# Patient Record
Sex: Female | Born: 1995 | Race: White | Hispanic: No | Marital: Single | Smoking: Never smoker
Health system: Southern US, Community
[De-identification: ages and names within clinical notes are randomized; demographics above are authoritative.]

## PROBLEM LIST (undated history)

## (undated) DIAGNOSIS — Z3490 Encounter for supervision of normal pregnancy, unspecified, unspecified trimester: Secondary | ICD-10-CM

## (undated) DIAGNOSIS — F32A Depression, unspecified: Secondary | ICD-10-CM

## (undated) DIAGNOSIS — F329 Major depressive disorder, single episode, unspecified: Secondary | ICD-10-CM

## (undated) HISTORY — DX: Depression, unspecified: F32.A

## (undated) HISTORY — DX: Major depressive disorder, single episode, unspecified: F32.9

---

## 2013-03-12 ENCOUNTER — Encounter (HOSPITAL_COMMUNITY): Payer: Self-pay | Admitting: Emergency Medicine

## 2013-03-12 ENCOUNTER — Emergency Department (HOSPITAL_COMMUNITY)
Admission: EM | Admit: 2013-03-12 | Discharge: 2013-03-12 | Disposition: A | Discharge status: Home / Self Care | Payer: Medicaid Other | Attending: Emergency Medicine | Admitting: Emergency Medicine

## 2013-03-12 DIAGNOSIS — M545 Low back pain, unspecified: Secondary | ICD-10-CM | POA: Insufficient documentation

## 2013-03-12 DIAGNOSIS — M549 Dorsalgia, unspecified: Secondary | ICD-10-CM

## 2013-03-12 DIAGNOSIS — O9989 Other specified diseases and conditions complicating pregnancy, childbirth and the puerperium: Secondary | ICD-10-CM | POA: Insufficient documentation

## 2013-03-12 DIAGNOSIS — Z79899 Other long term (current) drug therapy: Secondary | ICD-10-CM | POA: Insufficient documentation

## 2013-03-12 HISTORY — DX: Encounter for supervision of normal pregnancy, unspecified, unspecified trimester: Z34.90

## 2013-03-12 LAB — URINALYSIS, ROUTINE W REFLEX MICROSCOPIC
Bilirubin Urine: NEGATIVE
Glucose, UA: NEGATIVE mg/dL
Ketones, ur: 15 mg/dL — AB
Leukocytes, UA: NEGATIVE
Nitrite: NEGATIVE
Protein, ur: NEGATIVE mg/dL
Urobilinogen, UA: 0.2 mg/dL (ref 0.0–1.0)
pH: 6 (ref 5.0–8.0)

## 2013-03-12 NOTE — ED Notes (Signed)
Pt is pregnant,  ?3 mos.  Back pain, No urinay sx.  No bleeding.

## 2013-03-12 NOTE — ED Provider Notes (Signed)
CSN: [id number]     Arrival date & time 03/12/13  1846 History  This chart was scribed for Benny Lennert, MD by Bennett Scrape, ED Scribe. This patient was seen in room APA01/APA01 and the patient's care was started at 9:40 PM.  Chief Complaint  Patient presents with  . Back Pain    Patient is a 17 y.o. female presenting with back pain. The history is provided by the patient. No language interpreter was used.  Back Pain Location:  Lumbar spine Quality:  Aching Radiates to:  Does not radiate Pain severity:  Moderate Onset quality:  Sudden Timing:  Constant Progression:  Worsening Chronicity:  New Relieved by:  Being still Worsened by:  Palpation (moving) Ineffective treatments:  Ibuprofen (Tylenol) Associated symptoms: no abdominal pain, no chest pain, no fever and no headaches   Risk factors: pregnancy     HPI Comments: Daelynn Blower is a 17 y.o. female who is currently [redacted] weeks pregnant with no prior prenatal care presents to the Emergency Department complaining of lower back pain for the past week. She states the pain is worsened by moving. She reports taking one tylenol pill this morning that provided no relief. She denies any fever or cough. Her LMNP was on September 8th, 2014. She denies any prior medical problems.   She states she is in the process of moving so currently does not have a PCP.  Past Medical History  Diagnosis Date  . Pregnant    History reviewed. No pertinent past surgical history. History reviewed. No pertinent family history. History  Substance Use Topics  . Smoking status: Never Smoker   . Smokeless tobacco: Not on file  . Alcohol Use: No   No OB history provided.  Review of Systems  Constitutional: Negative for fever, appetite change and fatigue.  HENT: Negative for congestion, ear discharge and sinus pressure.   Eyes: Negative for discharge.  Respiratory: Negative for cough.   Cardiovascular: Negative for chest pain.  Gastrointestinal:  Negative for abdominal pain and diarrhea.  Genitourinary: Negative for frequency and hematuria.  Musculoskeletal: Positive for back pain.  Skin: Negative for rash.  Neurological: Negative for seizures and headaches.  Psychiatric/Behavioral: Negative for hallucinations.  All other systems reviewed and are negative.    Allergies  Review of patient's allergies indicates no known allergies.  Home Medications   Current Outpatient Rx  Name  Route  Sig  Dispense  Refill  . acetaminophen (TYLENOL) 500 MG tablet   Oral   Take 500 mg by mouth every 6 (six) hours as needed.         . Prenatal Vit-Min-FA-Fish Oil ([facility] PRENATAL GUMMY) 0.4-113.5 MG CHEW   Oral   Chew by mouth daily.          Triage Vitals: BP 108/55  Pulse 123  Temp(Src) 98.6 F (37 C) (Oral)  Resp 20  Ht 5\' 3"  (1.6 m)  Wt 195 lb (88.451 kg)  BMI 34.55 kg/m2  SpO2 100%  LMP 12/15/2012  Physical Exam  Nursing note and vitals reviewed. Constitutional: She is oriented to person, place, and time. She appears well-developed and well-nourished.  HENT:  Head: Normocephalic.  Eyes: Conjunctivae and EOM are normal. No scleral icterus.  Neck: Neck supple. No thyromegaly present.  Cardiovascular: Normal rate and regular rhythm.  Exam reveals no gallop and no friction rub.   No murmur heard. Pulmonary/Chest: Effort normal and breath sounds normal. No stridor. She has no wheezes. She has no rales. She exhibits  no tenderness.  Abdominal: She exhibits no distension. There is no tenderness. There is no rebound.  Musculoskeletal: Normal range of motion. She exhibits tenderness (Mild, Lumbar Spine). She exhibits no edema.  Lymphadenopathy:    She has no cervical adenopathy.  Neurological: She is oriented to person, place, and time. She exhibits normal muscle tone. Coordination normal.  Skin: Skin is warm and dry. No rash noted. No erythema.  Psychiatric: She has a normal mood and affect. Her behavior is normal.    ED  Course  Procedures (including critical care time)  DIAGNOSTIC STUDIES: Oxygen Saturation is 100% on room air, normal by my interpretation.    COORDINATION OF CARE: 9:45 PM-Discussed treatment plan which includes UA with pt at bedside and pt agreed to plan.      Labs Review Labs Reviewed  URINALYSIS, ROUTINE W REFLEX MICROSCOPIC  PREGNANCY, URINE   Imaging Review No results found.  EKG Interpretation   None       MDM  Back pain      The chart was scribed for me under my direct supervision.  I personally performed the history, physical, and medical decision making and all procedures in the evaluation of this patient.Benny Lennert, MD 03/13/13 [phone]

## 2013-03-12 NOTE — ED Notes (Signed)
Pt reports back pain x1 week - pain reported to be entire back (upper/mid/lower) - pt has taken tylenol OTC w/o relief, last dose was this a.m. Pt admits she is approx [redacted] weeks pregnant (LNMP 12/15/2012) has been experiencing some morning sickness, denies any fever, mechanism of injury or hx of back pain. Pt reports normal bowel and urinary patterns.

## 2013-10-15 ENCOUNTER — Encounter: Payer: Self-pay | Admitting: Family

## 2013-10-15 ENCOUNTER — Encounter (INDEPENDENT_AMBULATORY_CARE_PROVIDER_SITE_OTHER): Payer: Self-pay

## 2013-10-15 ENCOUNTER — Other Ambulatory Visit: Payer: Self-pay | Admitting: Family

## 2013-10-15 ENCOUNTER — Ambulatory Visit (INDEPENDENT_AMBULATORY_CARE_PROVIDER_SITE_OTHER): Payer: Medicaid Other | Admitting: Family

## 2013-10-15 VITALS — BP 136/73 | HR 75 | Temp 100.0°F | Ht 62.5 in | Wt 193.6 lb

## 2013-10-15 DIAGNOSIS — M546 Pain in thoracic spine: Secondary | ICD-10-CM

## 2013-10-15 LAB — POCT UA - MICROSCOPIC ONLY
Bacteria, U Microscopic: NEGATIVE
CASTS, UR, LPF, POC: NEGATIVE
Crystals, Ur, HPF, POC: NEGATIVE
Mucus, UA: NEGATIVE
Yeast, UA: NEGATIVE

## 2013-10-15 LAB — POCT URINALYSIS DIPSTICK
Bilirubin, UA: NEGATIVE
GLUCOSE UA: NEGATIVE
Ketones, UA: NEGATIVE
NITRITE UA: NEGATIVE
PH UA: 6
Protein, UA: NEGATIVE
Spec Grav, UA: 1.01
Urobilinogen, UA: NEGATIVE

## 2013-10-15 MED ORDER — CYCLOBENZAPRINE HCL 5 MG PO TABS: 5.0000 mg | ORAL_TABLET | Freq: Three times a day (TID) | ORAL | Status: DC | PRN

## 2013-10-15 MED ORDER — NITROFURANTOIN MONOHYD MACRO 100 MG PO CAPS: 100.0000 mg | ORAL_CAPSULE | Freq: Two times a day (BID) | ORAL | Status: DC

## 2013-10-15 MED ORDER — NAPROXEN 500 MG PO TABS: 500.0000 mg | ORAL_TABLET | Freq: Two times a day (BID) | ORAL | Status: DC

## 2013-10-15 NOTE — Patient Instructions (Signed)

## 2013-10-15 NOTE — Progress Notes (Signed)
   Subjective:    Patient ID: Kristin Morgan, female    DOB: Apr 02, 1996, 18 y.o.   MRN: [id number]  Back Pain This is a new problem. The current episode started yesterday. The problem occurs constantly. The problem is unchanged. The pain is present in the thoracic spine. The quality of the pain is described as aching. The pain does not radiate. The pain is at a severity of 6/10. The pain is moderate. The pain is the same all the time. The symptoms are aggravated by sitting and lying down. Pertinent negatives include no dysuria, headaches, pelvic pain, tingling or weakness. Risk factors include pregnancy. She has tried ice, NSAIDs and heat for the symptoms. The treatment provided moderate relief.   *Pt gave birth 09/29/13. Pt is not breastfeeding.    Review of Systems  Constitutional: Negative.   HENT: Negative.   Eyes: Negative.   Respiratory: Negative.  Negative for shortness of breath.   Cardiovascular: Negative.  Negative for palpitations.  Gastrointestinal: Negative.   Endocrine: Negative.   Genitourinary: Negative.  Negative for dysuria and pelvic pain.  Musculoskeletal: Positive for back pain.  Skin: Negative.   Neurological: Negative.  Negative for tingling, weakness and headaches.  Hematological: Negative.   Psychiatric/Behavioral: Negative.   All other systems reviewed and are negative.      Objective:   Physical Exam  Vitals reviewed. Constitutional: She is oriented to person, place, and time. She appears well-developed and well-nourished. No distress.  Eyes: Pupils are equal, round, and reactive to light.  Neck: Normal range of motion. Neck supple. No thyromegaly present.  Cardiovascular: Normal rate, regular rhythm, normal heart sounds and intact distal pulses.   No murmur heard. Pulmonary/Chest: Effort normal and breath sounds normal. No respiratory distress. She has no wheezes.  Abdominal: Soft. Bowel sounds are normal. She exhibits no distension. There is no tenderness.   Musculoskeletal: Normal range of motion. She exhibits no edema and no tenderness.  Full ROM, but slow movement due to pain  Neurological: She is alert and oriented to person, place, and time. She has normal reflexes. No cranial nerve deficit.  Skin: Skin is warm and dry.  Psychiatric: She has a normal mood and affect. Her behavior is normal. Judgment and thought content normal.    BP 136/73  Pulse 75  Temp(Src) 100 F (37.8 C) (Oral)  Ht 5' 2.5" (1.588 m)  Wt 193 lb 9.6 oz (87.816 kg)  BMI 34.82 kg/m2       Assessment & Plan:  1. Bilateral thoracic back pain -Rest -Ice -No NSAID's only tylenol prn -Discussed flexeril may cause drowsiness- - cyclobenzaprine (FLEXERIL) 5 MG tablet; Take 1 tablet (5 mg total) by mouth 3 (three) times daily as needed for muscle spasms.  Dispense: 30 tablet; Refill: 1 - naproxen (NAPROSYN) 500 MG tablet; Take 1 tablet (500 mg total) by mouth 2 (two) times daily with a meal.  Dispense: 60 tablet; Refill: 1  Orders Placed This Encounter  Procedures  . POCT urinalysis dipstick  . POCT UA - Microscopic Only    Jannifer Rodneyhristy Cleta Heatley, FNP

## 2013-10-22 ENCOUNTER — Ambulatory Visit (INDEPENDENT_AMBULATORY_CARE_PROVIDER_SITE_OTHER): Payer: Medicaid Other | Admitting: Family Medicine

## 2013-10-22 VITALS — BP 97/54 | HR 90 | Temp 97.1°F | Ht 62.5 in | Wt 195.6 lb

## 2013-10-22 DIAGNOSIS — W57XXXA Bitten or stung by nonvenomous insect and other nonvenomous arthropods, initial encounter: Secondary | ICD-10-CM

## 2013-10-22 DIAGNOSIS — T148 Other injury of unspecified body region: Secondary | ICD-10-CM

## 2013-10-22 MED ORDER — DOXYCYCLINE HYCLATE 100 MG PO TABS: 100.0000 mg | ORAL_TABLET | Freq: Two times a day (BID) | ORAL | Status: DC

## 2013-10-22 NOTE — Progress Notes (Signed)
   Subjective:    Patient ID: Kristin Morgan, female    DOB: Nov 28, 1995, 18 y.o.   MRN: [id number]  HPI This 18 y.o. female presents for evaluation of insect bite left thigh.   Review of Systems C/o insect bite No chest pain, SOB, HA, dizziness, vision change, N/V, diarrhea, constipation, dysuria, urinary urgency or frequency, myalgias, arthralgias or rash.     Objective:   Physical Exam Vital signs noted  Well developed well nourished female.  HEENT - Head atraumatic Normocephalic Respiratory - Lungs CTA bilateral Cardiac - RRR S1 and S2 without murmur Skin - Erythema with central injection site with central clearing and erythema approx 2cm diameter      Assessment & Plan:  Insect bite - Plan: doxycycline (VIBRA-TABS) 100 MG tablet bid x 10 days #20 Explained to use sun screen if outside Follow up prn  Deatra CanterWilliam J Oxford FNP

## 2013-12-15 ENCOUNTER — Ambulatory Visit (INDEPENDENT_AMBULATORY_CARE_PROVIDER_SITE_OTHER): Payer: Medicaid Other | Admitting: Family Medicine

## 2013-12-15 VITALS — BP 130/66 | HR 80 | Temp 97.3°F | Ht 62.5 in | Wt 202.6 lb

## 2013-12-15 DIAGNOSIS — J069 Acute upper respiratory infection, unspecified: Secondary | ICD-10-CM

## 2013-12-15 MED ORDER — PREDNISONE 10 MG PO TABS: ORAL_TABLET | ORAL | Status: DC

## 2013-12-15 MED ORDER — AZITHROMYCIN 250 MG PO TABS: ORAL_TABLET | ORAL | Status: DC

## 2013-12-15 NOTE — Progress Notes (Signed)
   Subjective:    Patient ID: Kristin Morgan, female    DOB: 03/26/1996, 18 y.o.   MRN: [id number]  HPI This 18 y.o. female presents for evaluation of URI.   Review of Systems No chest pain, SOB, HA, dizziness, vision change, N/V, diarrhea, constipation, dysuria, urinary urgency or frequency, myalgias, arthralgias or rash.     Objective:   Physical Exam  Vital signs noted  Well developed well nourished female.  HEENT - Head atraumatic Normocephalic                Eyes - PERRLA, Conjuctiva - clear Sclera- Clear EOMI                Ears - EAC's Wnl TM's Wnl Gross Hearing WNL                Nose - Nares patent                 Throat - oropharanx wnl Respiratory - Lungs CTA bilateral Cardiac - RRR S1 and S2 without murmur GI - Abdomen soft Nontender and bowel sounds active x 4 Extremities - No edema. Neuro - Grossly intact.      Assessment & Plan:  URI (upper respiratory infection) - Plan: azithromycin (ZITHROMAX) 250 MG tablet, predniSONE (DELTASONE) 10 MG tablet Push po fluids, rest, tylenol and motrin otc prn as directed for fever, arthralgias, and myalgias.  Follow up prn if sx's continue or persist.  Deatra Canter FNP

## 2014-01-01 ENCOUNTER — Encounter: Payer: Self-pay | Admitting: Family Medicine

## 2014-01-01 ENCOUNTER — Telehealth: Payer: Self-pay | Admitting: Family Medicine

## 2014-01-01 ENCOUNTER — Ambulatory Visit (INDEPENDENT_AMBULATORY_CARE_PROVIDER_SITE_OTHER): Payer: Medicaid Other | Admitting: Family Medicine

## 2014-01-01 VITALS — BP 112/59 | HR 73 | Temp 98.1°F | Ht 62.5 in | Wt 207.0 lb

## 2014-01-01 DIAGNOSIS — J0110 Acute frontal sinusitis, unspecified: Secondary | ICD-10-CM

## 2014-01-01 DIAGNOSIS — J0111 Acute recurrent frontal sinusitis: Secondary | ICD-10-CM

## 2014-01-01 MED ORDER — MONTELUKAST SODIUM 10 MG PO TABS: 10.0000 mg | ORAL_TABLET | Freq: Every day | ORAL | Status: DC

## 2014-01-01 MED ORDER — AMOXICILLIN 875 MG PO TABS: 875.0000 mg | ORAL_TABLET | Freq: Two times a day (BID) | ORAL | Status: DC

## 2014-01-01 MED ORDER — METHYLPREDNISOLONE ACETATE 80 MG/ML IJ SUSP
80.0000 mg | Freq: Once | INTRAMUSCULAR | Status: AC
  Administered 2014-01-01: 80 mg via INTRAMUSCULAR

## 2014-01-01 MED ORDER — FLUCONAZOLE 150 MG PO TABS: 150.0000 mg | ORAL_TABLET | Freq: Once | ORAL | Status: DC

## 2014-01-01 NOTE — Telephone Encounter (Signed)
Scheduled to see provider today

## 2014-01-01 NOTE — Progress Notes (Signed)
   Subjective:    Patient ID: Kristin Morgan, female    DOB: 1995-05-02, 18 y.o.   MRN: [id number]  HPI This 18 y.o. female presents for evaluation of sinus pressure and facial pain.  She is having green nasal discharge. She has chronic URI's and she states she has allergies and she cannot take  flonase nasal spray.   Review of Systems No chest pain, SOB, HA, dizziness, vision change, N/V, diarrhea, constipation, dysuria, urinary urgency or frequency, myalgias, arthralgias or rash.     Objective:   Physical Exam  Vital signs noted  Well developed well nourished female.  HEENT - Head atraumatic Normocephalic                Eyes - PERRLA, Conjuctiva - clear Sclera- Clear EOMI                Ears - EAC's Wnl TM's Wnl Gross Hearing WNL                Nose - Nares patent                 Throat - oropharanx wnl Respiratory - Lungs CTA bilateral Cardiac - RRR S1 and S2 without murmur GI - Abdomen soft Nontender and bowel sounds active x 4 Extremities - No edema. Neuro - Grossly intact.      Assessment & Plan:  Acute recurrent frontal sinusitis - Plan: amoxicillin (AMOXIL) 875 MG tablet, montelukast (SINGULAIR) 10 MG tablet, methylPREDNISolone acetate (DEPO-MEDROL) injection 80 mg, fluconazole (DIFLUCAN) 150 MG tablet  Deatra Canter FNP

## 2014-02-01 ENCOUNTER — Encounter (INDEPENDENT_AMBULATORY_CARE_PROVIDER_SITE_OTHER): Payer: Self-pay

## 2014-02-01 ENCOUNTER — Ambulatory Visit (INDEPENDENT_AMBULATORY_CARE_PROVIDER_SITE_OTHER): Payer: Medicaid Other | Admitting: Family Medicine

## 2014-02-01 ENCOUNTER — Encounter: Payer: Self-pay | Admitting: Family Medicine

## 2014-02-01 VITALS — BP 107/64 | HR 86 | Temp 97.1°F | Ht 62.5 in | Wt 202.2 lb

## 2014-02-01 DIAGNOSIS — R21 Rash and other nonspecific skin eruption: Secondary | ICD-10-CM

## 2014-02-01 MED ORDER — HYDROXYZINE HCL 25 MG PO TABS: 25.0000 mg | ORAL_TABLET | Freq: Three times a day (TID) | ORAL | Status: DC | PRN

## 2014-02-01 MED ORDER — METHYLPREDNISOLONE (PAK) 4 MG PO TABS: ORAL_TABLET | ORAL | Status: DC

## 2014-02-01 MED ORDER — METHYLPREDNISOLONE ACETATE 80 MG/ML IJ SUSP
80.0000 mg | Freq: Once | INTRAMUSCULAR | Status: AC
  Administered 2014-02-01: 80 mg via INTRAMUSCULAR

## 2014-02-01 NOTE — Progress Notes (Signed)
   Subjective:    Patient ID: Kristin Morgan, female    DOB: 1996/01/03, 18 y.o.   MRN: [id number]  HPI C/o rash on right arm, breast, abdomen, and back for 1 day.  She was exposed to cats which she is allergic to.  Review of Systems  Constitutional: Negative for fever.  HENT: Negative for ear pain.   Eyes: Negative for discharge.  Respiratory: Negative for cough.   Cardiovascular: Negative for chest pain.  Gastrointestinal: Negative for abdominal distention.  Endocrine: Negative for polyuria.  Genitourinary: Negative for difficulty urinating.  Musculoskeletal: Negative for gait problem and neck pain.  Skin: Positive for rash. Negative for color change.       Erythematous macular papular rash on right arm, abdomen, chest, and back.  Neurological: Negative for speech difficulty and headaches.  Psychiatric/Behavioral: Negative for agitation.       Objective:    BP 107/64  Pulse 86  Temp(Src) 97.1 F (36.2 C) (Oral)  Ht 5' 2.5" (1.588 m)  Wt 202 lb 3.2 oz (91.717 kg)  BMI 36.37 kg/m2 Physical Exam  Constitutional: She is oriented to person, place, and time. She appears well-developed and well-nourished.  HENT:  Head: Normocephalic and atraumatic.  Mouth/Throat: Oropharynx is clear and moist.  Eyes: Pupils are equal, round, and reactive to light.  Neck: Normal range of motion. Neck supple.  Cardiovascular: Normal rate and regular rhythm.   No murmur heard. Pulmonary/Chest: Effort normal and breath sounds normal.  Abdominal: Soft. Bowel sounds are normal. There is no tenderness.  Neurological: She is alert and oriented to person, place, and time.  Skin: Skin is warm and dry. Rash noted.  Psychiatric: She has a normal mood and affect.          Assessment & Plan:     ICD-9-CM ICD-10-CM   1. Rash and nonspecific skin eruption 782.1 R21 methylPREDNISolone acetate (DEPO-MEDROL) injection 80 mg     methylPREDNIsolone (MEDROL DOSPACK) 4 MG tablet     hydrOXYzine  (ATARAX/VISTARIL) 25 MG tablet     Return if symptoms worsen or fail to improve.  Deatra CanterWilliam J Yvaine Jankowiak FNP

## 2014-02-02 ENCOUNTER — Ambulatory Visit: Payer: Self-pay | Admitting: Family Medicine

## 2014-02-02 ENCOUNTER — Telehealth: Payer: Self-pay | Admitting: Family Medicine

## 2014-02-02 NOTE — Telephone Encounter (Signed)
Pt cb, states she received depo-medrol injection in office yesterday and put on steroids, for allergic reaction, pt states rash has spread and raised and now is burning on the inside. Pt states she's not having trouble breathing or facial swelling. Pt given appt with Ander SladeBill Oxford today at 6:30

## 2014-02-17 ENCOUNTER — Telehealth: Payer: Self-pay | Admitting: Nurse Practitioner

## 2014-02-17 MED ORDER — FLUCONAZOLE 150 MG PO TABS: 150.0000 mg | ORAL_TABLET | Freq: Once | ORAL | Status: DC

## 2014-02-17 NOTE — Telephone Encounter (Signed)
Stp, she is requesting rx for yeast infections, states she was seen by Annette StableBill on 02/01/14 and she was given Medrol-dose pack for rash, she has had to take multiple oatmeal baths since the rash and thinks this is how she got the yeast infection. Her symptoms include vaginal itching, redness, white cottage cheese like discharge. No foul vaginal odors. Please advise, [facility]

## 2014-03-03 ENCOUNTER — Telehealth: Payer: Self-pay | Admitting: Family Medicine

## 2014-03-03 MED ORDER — AZITHROMYCIN 250 MG PO TABS: ORAL_TABLET | ORAL | Status: DC

## 2014-03-03 NOTE — Telephone Encounter (Signed)
Sinus infection -  Yellow mucus Headache Drainage Sinus pressure   [facility]  Wants zpak called in today

## 2014-03-03 NOTE — Telephone Encounter (Signed)
This is okay to give her a Z-Pak and please instruct her to use saline nose spray frequently. If she is not better she needs to come in and be seen next week.

## 2014-03-03 NOTE — Telephone Encounter (Signed)
Pt aware and med sent in  

## 2014-03-19 ENCOUNTER — Other Ambulatory Visit: Payer: Self-pay | Admitting: Family Medicine

## 2014-03-19 DIAGNOSIS — R21 Rash and other nonspecific skin eruption: Secondary | ICD-10-CM

## 2014-03-19 MED ORDER — HYDROXYZINE HCL 25 MG PO TABS: 25.0000 mg | ORAL_TABLET | Freq: Three times a day (TID) | ORAL | Status: DC | PRN

## 2014-03-19 MED ORDER — METHYLPREDNISOLONE (PAK) 4 MG PO TABS: ORAL_TABLET | ORAL | Status: DC

## 2014-05-22 ENCOUNTER — Telehealth: Payer: Self-pay | Admitting: Family

## 2014-05-24 NOTE — Telephone Encounter (Signed)
Sorry has not been seen since October- will NTBS

## 2014-05-25 ENCOUNTER — Ambulatory Visit (INDEPENDENT_AMBULATORY_CARE_PROVIDER_SITE_OTHER): Payer: Medicaid Other | Admitting: Nurse Practitioner

## 2014-05-25 ENCOUNTER — Encounter: Payer: Self-pay | Admitting: Nurse Practitioner

## 2014-05-25 VITALS — BP 114/69 | HR 84 | Temp 97.3°F | Ht 62.5 in | Wt 210.0 lb

## 2014-05-25 DIAGNOSIS — J0100 Acute maxillary sinusitis, unspecified: Secondary | ICD-10-CM

## 2014-05-25 MED ORDER — AZITHROMYCIN 250 MG PO TABS: 250.0000 mg | ORAL_TABLET | Freq: Every day | ORAL | Status: DC

## 2014-05-25 NOTE — Telephone Encounter (Signed)
Stp given appt today with MMM at 6:15.

## 2014-05-25 NOTE — Progress Notes (Signed)
  Subjective:     Kristin Morgan is a 19 y.o. female who presents for evaluation of symptoms of a URI, possible sinusitis. Symptoms include congestion, cough described as productive and productive of yellow sputum, nasal congestion, no  fever, post nasal drip,  sinus pressure, sneezing and sore throat. Onset of symptoms was 3 days ago, and has been gradually worsening since that time. Treatment to date: antihistamines and decongestants.  The following portions of the patient's history were reviewed and updated as appropriate: allergies, current medications, past family history, past medical history, past social history, past surgical history and problem list.  Review of Systems A comprehensive review of systems was negative.   Objective:    BP 114/69 mmHg  Pulse 84  Temp(Src) 97.3 F (36.3 C) (Oral)  Ht 5' 2.5" (1.588 m)  Wt 210 lb (95.255 kg)  BMI 37.77 kg/m2 General appearance: alert, cooperative and appears stated age Head: Normocephalic, without obvious abnormality, atraumatic Eyes: conjunctivae/corneas clear. PERRL, EOM's intact. Fundi benign. Ears: normal TM's and external ear canals both ears Nose: Nares normal. Septum midline. Mucosa normal. No drainage. Maxillary sinus tenderness bilaterally. Throat: lips, mucosa, and tongue normal; teeth and gums normal Neck: no adenopathy, no carotid bruit, no JVD, supple, symmetrical, trachea midline and thyroid not enlarged, symmetric, no tenderness/mass/nodules Lungs: clear to auscultation bilaterally  Cardiac: RRR, No murmur, gallop,  Neurologic: Grossly normal   Assessment:  Maxillary sinusitis    Plan:     1. Take meds as prescribed 2. Use a cool mist humidifier especially during the winter months and when heat has been humid. 3. Use saline nose sprays frequently 4. Saline irrigations of the nose can be very helpful if done frequently.  * 4X daily for 1 week*  * Use of a nettie pot can be helpful with this. Follow directions with  this* 5. Drink plenty of fluids 6. Keep thermostat turn down low 7.For any cough or congestion  Use plain Mucinex- regular strength or max strength is fine   * Children- consult with Pharmacist for dosing 8. For fever or aces or pains- take tylenol or ibuprofen appropriate for age and weight.  * for fevers greater than 101 orally you may alternate ibuprofen and tylenol every  3 hours.   Meds ordered this encounter  Medications  . azithromycin (ZITHROMAX) 250 MG tablet    Sig: Take 1 tablet (250 mg total) by mouth daily.    Dispense:  6 each    Refill:  0    Order Specific Question:  Supervising Provider    Answer:  Ernestina PennaMOORE, DONALD W [id number]]   Kristin Daphine DeutscherMartin, FNP

## 2014-05-25 NOTE — Patient Instructions (Signed)

## 2014-06-10 ENCOUNTER — Telehealth: Payer: Self-pay | Admitting: Nurse Practitioner

## 2014-06-10 NOTE — Telephone Encounter (Signed)
Green/yellow nasal discharge, headache, sinus pressure.  Given Zpak on 05/25/13. Symptoms returned a few days ago.  She isn't taking anything OTC. Suggested pseudoephedrine 30mg  1-2 tabs every 4-6 hours and ibuprofen 400mg  every 4-6 hrs for pain. Increase fluid intake. Avoid dairy.  If symptoms worsen, fever devleops, or symptoms do not improve over the next few days she will need to be seen.  Patient stated understanding and agreement to plan.

## 2014-06-21 ENCOUNTER — Ambulatory Visit (INDEPENDENT_AMBULATORY_CARE_PROVIDER_SITE_OTHER): Payer: Medicaid Other | Admitting: Family

## 2014-06-21 ENCOUNTER — Encounter: Payer: Self-pay | Admitting: Family

## 2014-06-21 VITALS — BP 112/66 | HR 104 | Temp 96.9°F | Ht 62.5 in | Wt 211.4 lb

## 2014-06-21 DIAGNOSIS — L309 Dermatitis, unspecified: Secondary | ICD-10-CM

## 2014-06-21 DIAGNOSIS — J019 Acute sinusitis, unspecified: Secondary | ICD-10-CM

## 2014-06-21 MED ORDER — AMOXICILLIN-POT CLAVULANATE 875-125 MG PO TABS: 1.0000 | ORAL_TABLET | Freq: Two times a day (BID) | ORAL | Status: DC

## 2014-06-21 MED ORDER — TRIAMCINOLONE ACETONIDE 0.025 % EX OINT: 1.0000 "application " | TOPICAL_OINTMENT | Freq: Two times a day (BID) | CUTANEOUS | Status: DC

## 2014-06-21 NOTE — Patient Instructions (Addendum)
Sinusitis Sinusitis is redness, soreness, and inflammation of the paranasal sinuses. Paranasal sinuses are air pockets within the bones of your face (beneath the eyes, the middle of the forehead, or above the eyes). In healthy paranasal sinuses, mucus is able to drain out, and air is able to circulate through them by way of your nose. However, when your paranasal sinuses are inflamed, mucus and air can become trapped. This can allow bacteria and other germs to grow and cause infection. Sinusitis can develop quickly and last only a short time (acute) or continue over a long period (chronic). Sinusitis that lasts for more than 12 weeks is considered chronic.  CAUSES  Causes of sinusitis include:  Allergies.  Structural abnormalities, such as displacement of the cartilage that separates your nostrils (deviated septum), which can decrease the air flow through your nose and sinuses and affect sinus drainage.  Functional abnormalities, such as when the small hairs (cilia) that line your sinuses and help remove mucus do not work properly or are not present. SIGNS AND SYMPTOMS  Symptoms of acute and chronic sinusitis are the same. The primary symptoms are pain and pressure around the affected sinuses. Other symptoms include:  Upper toothache.  Earache.  Headache.  Bad breath.  Decreased sense of smell and taste.  A cough, which worsens when you are lying flat.  Fatigue.  Fever.  Thick drainage from your nose, which often is green and may contain pus (purulent).  Swelling and warmth over the affected sinuses. DIAGNOSIS  Your health care provider will perform a physical exam. During the exam, your health care provider may:  Look in your nose for signs of abnormal growths in your nostrils (nasal polyps).  Tap over the affected sinus to check for signs of infection.  View the inside of your sinuses (endoscopy) using an imaging device that has a light attached (endoscope). If your health  care provider suspects that you have chronic sinusitis, one or more of the following tests may be recommended:  Allergy tests.  Nasal culture. A sample of mucus is taken from your nose, sent to a lab, and screened for bacteria.  Nasal cytology. A sample of mucus is taken from your nose and examined by your health care provider to determine if your sinusitis is related to an allergy. TREATMENT  Most cases of acute sinusitis are related to a viral infection and will resolve on their own within 10 days. Sometimes medicines are prescribed to help relieve symptoms (pain medicine, decongestants, nasal steroid sprays, or saline sprays).  However, for sinusitis related to a bacterial infection, your health care provider will prescribe antibiotic medicines. These are medicines that will help kill the bacteria causing the infection.  Rarely, sinusitis is caused by a fungal infection. In theses cases, your health care provider will prescribe antifungal medicine. For some cases of chronic sinusitis, surgery is needed. Generally, these are cases in which sinusitis recurs more than 3 times per year, despite other treatments. HOME CARE INSTRUCTIONS   Drink plenty of water. Water helps thin the mucus so your sinuses can drain more easily.  Use a humidifier.  Inhale steam 3 to 4 times a day (for example, sit in the bathroom with the shower running).  Apply a warm, moist washcloth to your face 3 to 4 times a day, or as directed by your health care provider.  Use saline nasal sprays to help moisten and clean your sinuses.  Take medicines only as directed by your health care provider.    If you were prescribed either an antibiotic or antifungal medicine, finish it all even if you start to feel better. SEEK IMMEDIATE MEDICAL CARE IF:  You have increasing pain or severe headaches.  You have nausea, vomiting, or drowsiness.  You have swelling around your face.  You have vision problems.  You have a stiff  neck.  You have difficulty breathing. MAKE SURE YOU:   Understand these instructions.  Will watch your condition.  Will get help right away if you are not doing well or get worse. Document Released: 03/26/2005 Document Revised: 08/10/2013 Document Reviewed: 04/10/2011 [facility] Patient Information 2015 [place], [place]. This information is not intended to replace advice given to you by your health care provider. Make sure you discuss any questions you have with your health care provider. Eczema Eczema, also called atopic dermatitis, is a skin disorder that causes inflammation of the skin. It causes a red rash and dry, scaly skin. The skin becomes very itchy. Eczema is generally worse during the cooler winter months and often improves with the warmth of summer. Eczema usually starts showing signs in infancy. Some children outgrow eczema, but it may last through adulthood.  CAUSES  The exact cause of eczema is not known, but it appears to run in families. People with eczema often have a family history of eczema, allergies, asthma, or hay fever. Eczema is not contagious. Flare-ups of the condition may be caused by:   Contact with something you are sensitive or allergic to.   Stress. SIGNS AND SYMPTOMS  Dry, scaly skin.   Red, itchy rash.   Itchiness. This may occur before the skin rash and may be very intense.  DIAGNOSIS  The diagnosis of eczema is usually made based on symptoms and medical history. TREATMENT  Eczema cannot be cured, but symptoms usually can be controlled with treatment and other strategies. A treatment plan might include:  Controlling the itching and scratching.   Use over-the-counter antihistamines as directed for itching. This is especially useful at night when the itching tends to be worse.   Use over-the-counter steroid creams as directed for itching.   Avoid scratching. Scratching makes the rash and itching worse. It may also result in a skin infection  (impetigo) due to a break in the skin caused by scratching.   Keeping the skin well moisturized with creams every day. This will seal in moisture and help prevent dryness. Lotions that contain alcohol and water should be avoided because they can dry the skin.   Limiting exposure to things that you are sensitive or allergic to (allergens).   Recognizing situations that cause stress.   Developing a plan to manage stress.  HOME CARE INSTRUCTIONS   Only take over-the-counter or prescription medicines as directed by your health care provider.   Do not use anything on the skin without checking with your health care provider.   Keep baths or showers short (5 minutes) in warm (not hot) water. Use mild cleansers for bathing. These should be unscented. You may add nonperfumed bath oil to the bath water. It is best to avoid soap and bubble bath.   Immediately after a bath or shower, when the skin is still damp, apply a moisturizing ointment to the entire body. This ointment should be a petroleum ointment. This will seal in moisture and help prevent dryness. The thicker the ointment, the better. These should be unscented.   Keep fingernails cut short. Children with eczema may need to wear soft gloves or mittens at  night after applying an ointment.   Dress in clothes made of cotton or cotton blends. Dress lightly, because heat increases itching.   A child with eczema should stay away from anyone with fever blisters or cold sores. The virus that causes fever blisters (herpes simplex) can cause a serious skin infection in children with eczema. SEEK MEDICAL CARE IF:   Your itching interferes with sleep.   Your rash gets worse or is not better within 1 week after starting treatment.   You see pus or soft yellow scabs in the rash area.   You have a fever.   You have a rash flare-up after contact with someone who has fever blisters.  Document Released: 03/23/2000 Document Revised:  01/14/2013 Document Reviewed: 10/27/2012 [facility] Patient Information 2015 [place]. This information is not intended to replace advice given to you by your health care provider. Make sure you discuss any questions you have with your health care provider.

## 2014-06-21 NOTE — Progress Notes (Signed)
   Subjective:    Patient ID: Kristin Morgan, female    DOB: 06-15-1995, 19 y.o.   MRN: [id number]  Sinusitis This is a new problem. The current episode started yesterday. The problem is unchanged. There has been no fever. Her pain is at a severity of 4/10. The pain is mild. Associated symptoms include congestion, ear pain, headaches, shortness of breath, sinus pressure, a sore throat and swollen glands. Pertinent negatives include no coughing or sneezing. Past treatments include nothing. The treatment provided no relief.      Review of Systems  Constitutional: Positive for fatigue.  HENT: Positive for congestion, ear pain, sinus pressure and sore throat. Negative for sneezing.   Eyes: Negative.   Respiratory: Positive for shortness of breath. Negative for cough.   Cardiovascular: Negative.  Negative for palpitations.  Gastrointestinal: Negative.   Endocrine: Negative.   Genitourinary: Negative.   Musculoskeletal: Negative.   Neurological: Positive for headaches.  Hematological: Negative.   Psychiatric/Behavioral: Negative.   All other systems reviewed and are negative.      Objective:   Physical Exam  Constitutional: She is oriented to person, place, and time. She appears well-developed and well-nourished. No distress.  HENT:  Head: Normocephalic and atraumatic.  Right Ear: External ear normal.  Left Ear: External ear normal.  Nasal passage erythemas with mild swelling  Oropharynx erythemas   Eyes: Pupils are equal, round, and reactive to light.  Neck: Normal range of motion. Neck supple. No thyromegaly present.  Cardiovascular: Normal rate, regular rhythm, normal heart sounds and intact distal pulses.   No murmur heard. Pulmonary/Chest: Effort normal and breath sounds normal. No respiratory distress. She has no wheezes.  Abdominal: Soft. Bowel sounds are normal. She exhibits no distension. There is no tenderness.  Musculoskeletal: Normal range of motion. She exhibits no edema  or tenderness.  Neurological: She is alert and oriented to person, place, and time. She has normal reflexes. No cranial nerve deficit.  Skin: Skin is warm and dry.  Psychiatric: She has a normal mood and affect. Her behavior is normal. Judgment and thought content normal.  Vitals reviewed.    BP 112/66 mmHg  Pulse 104  Temp(Src) 96.9 F (36.1 C) (Oral)  Ht 5' 2.5" (1.588 m)  Wt 211 lb 6.4 oz (95.89 kg)  BMI 38.03 kg/m2  LMP 06/01/2014      Assessment & Plan:  1. Acute sinusitis, recurrence not specified, unspecified location -- Take meds as prescribed - Use a cool mist humidifier  -Use saline nose sprays frequently -Saline irrigations of the nose can be very helpful if done frequently.  * 4X daily for 1 week*  * Use of a nettie pot can be helpful with this. Follow directions with this* -Force fluids -For any cough or congestion  Use plain Mucinex- regular strength or max strength is fine   * Children- consult with Pharmacist for dosing -For fever or aces or pains- take tylenol or ibuprofen appropriate for age and weight.  * for fevers greater than 101 orally you may alternate ibuprofen and tylenol every  3 hours. -Throat lozenges if help - amoxicillin-clavulanate (AUGMENTIN) 875-125 MG per tablet; Take 1 tablet by mouth 2 (two) times daily.  Dispense: 14 tablet; Refill: 0  2. Eczema -Keep moisturized   - triamcinolone (KENALOG) 0.025 % ointment; Apply 1 application topically 2 (two) times daily.  Dispense: 30 g; Refill: 0   Jannifer Rodneyhristy Hawks, FNP

## 2014-06-29 ENCOUNTER — Telehealth: Payer: Self-pay | Admitting: Family

## 2014-06-29 MED ORDER — FLUCONAZOLE 150 MG PO TABS: 150.0000 mg | ORAL_TABLET | Freq: Once | ORAL | Status: DC

## 2014-06-29 NOTE — Telephone Encounter (Signed)
Prescription sent to pharmacy.

## 2014-12-18 ENCOUNTER — Ambulatory Visit: Payer: Medicaid Other | Admitting: Family Medicine

## 2014-12-31 ENCOUNTER — Encounter: Payer: Self-pay | Admitting: Physician Assistant

## 2014-12-31 ENCOUNTER — Ambulatory Visit (INDEPENDENT_AMBULATORY_CARE_PROVIDER_SITE_OTHER): Payer: Medicaid Other | Admitting: Physician Assistant

## 2014-12-31 VITALS — BP 126/85 | HR 91 | Temp 97.3°F | Ht 62.5 in | Wt 226.0 lb

## 2014-12-31 DIAGNOSIS — Z3041 Encounter for surveillance of contraceptive pills: Secondary | ICD-10-CM

## 2014-12-31 MED ORDER — NORGESTIM-ETH ESTRAD TRIPHASIC 0.18/0.215/0.25 MG-35 MCG PO TABS: 1.0000 | ORAL_TABLET | Freq: Every day | ORAL | Status: DC

## 2014-12-31 NOTE — Progress Notes (Signed)
   Subjective:    Patient ID: Kristin Morgan, female    DOB: 07/20/1995, 19 y.o.   MRN: [id number]  HPI 19 y/o female presents for consult on oral contraceptives. She currently has the Nuvaring but has difficulty inserting it. She had the IUD but had problems with it and doesn't want to try it. She was started on the nuvaring by her obgyn. She is currently having her menstrual cycle.     Review of Systems  Constitutional: Negative.   HENT: Negative.   Eyes: Negative.   Respiratory: Negative.   Cardiovascular: Negative.   Gastrointestinal: Negative.   Endocrine: Negative.   Genitourinary: Menstrual problem: regular menstrual cycle   Musculoskeletal: Negative.   Skin: Negative.   Allergic/Immunologic: Negative.   Neurological: Negative.   Psychiatric/Behavioral: Negative.        Objective:   Physical Exam  Constitutional: She is oriented to person, place, and time. She appears well-developed and well-nourished. No distress.  HENT:  Head: Normocephalic and atraumatic.  Cardiovascular: Normal rate, regular rhythm, normal heart sounds and intact distal pulses.  Exam reveals no gallop and no friction rub.   No murmur heard. Pulmonary/Chest: Effort normal and breath sounds normal. No respiratory distress. She has no wheezes. She has no rales. She exhibits no tenderness.  Abdominal:  Obese   Musculoskeletal: She exhibits no edema.  Neurological: She is alert and oriented to person, place, and time.  Psychiatric: She has a normal mood and affect. Her behavior is normal. Judgment and thought content normal.  Nursing note and vitals reviewed.         Assessment & Plan:  1. Oral contraceptive use - Since patient is menstruating I have advised her to start birth control and use a back up method for a month. Instructions were given to patient. She will follow up in 3 months for recheck and have a pap in 1 year.   - Norgestimate-Ethinyl Estradiol Triphasic 0.18/0.215/0.25 MG-35 MCG  tablet; Take 1 tablet by mouth daily.  Dispense: 1 Package; Refill: 2   RTO 3 months   Tiffany A. Chauncey Reading PA-C

## 2014-12-31 NOTE — Patient Instructions (Signed)
Oral Contraception Information Oral contraceptive pills (OCPs) are medicines taken to prevent pregnancy. OCPs work by preventing the ovaries from releasing eggs. The hormones in OCPs also cause the cervical mucus to thicken, preventing the sperm from entering the uterus. The hormones also cause the uterine lining to become thin, not allowing a fertilized egg to attach to the inside of the uterus. OCPs are highly effective when taken exactly as prescribed. However, OCPs do not prevent sexually transmitted diseases (STDs). Safe sex practices, such as using condoms along with the pill, can help prevent STDs.  Before taking the pill, you may have a physical exam and Pap test. Your health care Kristin Morgan may order blood tests. The health care Alexsus Papadopoulos will make sure you are a good candidate for oral contraception. Discuss with your health care Shanterica Biehler the possible side effects of the OCP you may be prescribed. When starting an OCP, it can take 2 to 3 months for the body to adjust to the changes in hormone levels in your body.  TYPES OF ORAL CONTRACEPTION  The combination pill--This pill contains estrogen and progestin (synthetic progesterone) hormones. The combination pill comes in 21-day, 28-day, or 91-day packs. Some types of combination pills are meant to be taken continuously (365-day pills). With 21-day packs, you do not take pills for 7 days after the last pill. With 28-day packs, the pill is taken every day. The last 7 pills are without hormones. Certain types of pills have more than 21 hormone-containing pills. With 91-day packs, the first 84 pills contain both hormones, and the last 7 pills contain no hormones or contain estrogen only.  The minipill--This pill contains the progesterone hormone only. The pill is taken every day continuously. It is very important to take the pill at the same time each day. The minipill comes in packs of 28 pills. All 28 pills contain the hormone.  ADVANTAGES OF ORAL  CONTRACEPTIVE PILLS  Decreases premenstrual symptoms.   Treats menstrual period cramps.   Regulates the menstrual cycle.   Decreases a heavy menstrual flow.   May treatacne, depending on the type of pill.   Treats abnormal uterine bleeding.   Treats polycystic ovarian syndrome.   Treats endometriosis.   Can be used as emergency contraception.  THINGS THAT CAN MAKE ORAL CONTRACEPTIVE PILLS LESS EFFECTIVE OCPs can be less effective if:   You forget to take the pill at the same time every day.   You have a stomach or intestinal disease that lessens the absorption of the pill.   You take OCPs with other medicines that make OCPs less effective, such as antibiotics, certain HIV medicines, and some seizure medicines.   You take expired OCPs.   You forget to restart the pill on day 7, when using the packs of 21 pills.  RISKS ASSOCIATED WITH ORAL CONTRACEPTIVE PILLS  Oral contraceptive pills can sometimes cause side effects, such as:  Headache.  Nausea.  Breast tenderness.  Irregular bleeding or spotting. Combination pills are also associated with a small increased risk of:  Blood clots.  Heart attack.  Stroke. Document Released: 06/16/2002 Document Revised: 01/14/2013 Document Reviewed: 09/14/2012 [employer] Patient Information 2015 [employer]. This information is not intended to replace advice given to you by your health care Kaiyu Mirabal. Make sure you discuss any questions you have with your health care Endy Easterly.  

## 2015-01-18 ENCOUNTER — Telehealth: Payer: Self-pay | Admitting: Family

## 2015-01-18 NOTE — Telephone Encounter (Signed)
Give it a month and see if will regulate her.

## 2015-01-18 NOTE — Telephone Encounter (Signed)
Returned patient's phone call.  Patient states she was previously had the nuvaring and was switch to [facility] pills 9/23. Patient states that she was on her menstrual cycle when in seen in the office on 9/23 and started [facility] pills that day. She has been having pelvic pain and is still bleeding.  Patient wants to know if this is normal?

## 2015-01-18 NOTE — Telephone Encounter (Signed)
Patient aware.

## 2015-01-20 ENCOUNTER — Telehealth: Payer: Self-pay | Admitting: Family

## 2015-01-20 MED ORDER — AMOXICILLIN-POT CLAVULANATE 875-125 MG PO TABS: 1.0000 | ORAL_TABLET | Freq: Two times a day (BID) | ORAL | Status: DC

## 2015-01-20 NOTE — Telephone Encounter (Signed)
Pt aware abx was sent to pharmacy 

## 2015-01-20 NOTE — Telephone Encounter (Signed)
Augmentin Prescription sent to pharmacy   

## 2015-01-21 ENCOUNTER — Ambulatory Visit (INDEPENDENT_AMBULATORY_CARE_PROVIDER_SITE_OTHER): Payer: Medicaid Other | Admitting: Pediatrics

## 2015-01-21 ENCOUNTER — Encounter: Payer: Self-pay | Admitting: Pediatrics

## 2015-01-21 VITALS — BP 120/73 | HR 84 | Temp 97.1°F | Ht 62.5 in | Wt 225.8 lb

## 2015-01-21 DIAGNOSIS — N939 Abnormal uterine and vaginal bleeding, unspecified: Secondary | ICD-10-CM | POA: Diagnosis not present

## 2015-01-21 DIAGNOSIS — Z309 Encounter for contraceptive management, unspecified: Secondary | ICD-10-CM | POA: Diagnosis not present

## 2015-01-21 DIAGNOSIS — Z3200 Encounter for pregnancy test, result unknown: Secondary | ICD-10-CM | POA: Diagnosis not present

## 2015-01-21 LAB — POCT URINE PREGNANCY: PREG TEST UR: NEGATIVE

## 2015-01-21 MED ORDER — NORGESTIMATE-ETH ESTRADIOL 0.25-35 MG-MCG PO TABS: 1.0000 | ORAL_TABLET | Freq: Every day | ORAL | Status: DC

## 2015-01-21 NOTE — Progress Notes (Signed)
Subjective:    Patient ID: Kristin Morgan, female    DOB: [birth year], 19 y.o.   MRN: [id number]  CC: bleeding for past month  HPI: Kristin Morgan is a 19 y.o. female presenting on 01/21/2015 for Vaginal Bleeding; Emesis; and pain abdomen  3 months after 76mo son born had an IUD, then was on nuvaring, switched to OCP about a month ago Has not always used backup/barrier since starting OCP 1 month ago Has had some nausea past few days, is wondering if she could be pregnant.  Was started on augmentin two days ago for acute sinusitis. While on nuvaring (for several months) had 3 day periods, had ring in 4 weeks, switch to next one For the past month has had bleeding daily, at first spotting, then needing tampons, changing 1-2 times a day which was how much she was on while nuvaring LLQ pain for past week intermittently, notices it when she moves, feels it pulling when she turns, can ignore the pain.normal bowel movements, normal urination, normal appetite No burning when she urinates  Relevant past medical, surgical, family and social history reviewed and updated as indicated. Interim medical history since our last visit reviewed. Allergies and medications reviewed and updated.   ROS: Per HPI unless specifically indicated above  Past Medical History There are no active problems to display for this patient.   Current Outpatient Prescriptions  Medication Sig Dispense Refill  . amoxicillin-clavulanate (AUGMENTIN) 875-125 MG tablet Take 1 tablet by mouth 2 (two) times daily. 14 tablet 0  . norgestimate-ethinyl estradiol (ORTHO-CYCLEN,SPRINTEC,PREVIFEM) 0.25-35 MG-MCG tablet Take 1 tablet by mouth daily. 1 Package 11   No current facility-administered medications for this visit.       Objective:    BP 120/73 mmHg  Pulse 84  Temp(Src) 97.1 F (36.2 C) (Oral)  Ht 5' 2.5" (1.588 m)  Wt 225 lb 12.8 oz (102.422 kg)  BMI 40.62 kg/m2  LMP 12/28/2014 (Exact Date)  Wt Readings from Last 3  Encounters:  01/21/15 225 lb 12.8 oz (102.422 kg)  12/31/14 226 lb (102.513 kg)  06/21/14 211 lb 6.4 oz (95.89 kg)    Gen: NAD, alert, cooperative with exam, NCAT EYES: EOMI, no scleral injection or icterus ENT:  TMs pearly gray b/l, OP mildly erythematous LYMPH: no cervical LAD CV: NRRR, normal S1/S2, no murmur, distal pulses 2+ b/l Resp: CTABL, no wheezes, normal WOB Abd: +BS, soft, no tenderness with palpation LLQ, no guarding or organomegaly Ext: No edema, warm Neuro: Alert and oriented     Assessment & Plan:   [name] was seen today for vaginal bleeding. Likely from switching birth control multiple times recently. Will switch of off the cyclic OCP and do regular dose throughout 21 active days. Don't skip inactive pills. POCT urine preg test is negative. WIll send beta HCG, though unlikely to be pregnant with taking daily OCP past month. Antibiotic only recently started. May take another cycle for spotting to slow/stop. Use condoms as back up.  Diagnoses and all orders for this visit:  Possible pregnancy -     POCT urine pregnancy -     Beta HCG, Quant (tumor marker)  Encounter for contraceptive management, unspecified encounter -     norgestimate-ethinyl estradiol (ORTHO-CYCLEN,SPRINTEC,PREVIFEM) 0.25-35 MG-MCG tablet; Take 1 tablet by mouth daily.  Abnormal uterine bleeding -     norgestimate-ethinyl estradiol (ORTHO-CYCLEN,SPRINTEC,PREVIFEM) 0.25-35 MG-MCG tablet; Take 1 tablet by mouth daily.   Follow up plan: Return if symptoms worsen or fail to improve.  Rex Krasarol Vincent, MD [facility] 01/21/2015, 1:43 PM

## 2015-01-22 LAB — BETA HCG QUANT (REF LAB): hCG Quant: <1 m[IU]/mL

## 2015-02-25 ENCOUNTER — Encounter: Payer: Self-pay | Admitting: Family Medicine

## 2015-02-25 ENCOUNTER — Encounter (INDEPENDENT_AMBULATORY_CARE_PROVIDER_SITE_OTHER): Payer: Self-pay

## 2015-02-25 ENCOUNTER — Ambulatory Visit (INDEPENDENT_AMBULATORY_CARE_PROVIDER_SITE_OTHER): Payer: Medicaid Other | Admitting: Family Medicine

## 2015-02-25 VITALS — BP 124/76 | HR 86 | Temp 97.1°F | Ht 62.5 in | Wt 225.4 lb

## 2015-02-25 DIAGNOSIS — R0982 Postnasal drip: Secondary | ICD-10-CM | POA: Diagnosis not present

## 2015-02-25 DIAGNOSIS — A084 Viral intestinal infection, unspecified: Secondary | ICD-10-CM

## 2015-02-25 MED ORDER — ONDANSETRON 4 MG PO TBDP: 4.0000 mg | ORAL_TABLET | Freq: Three times a day (TID) | ORAL | Status: DC | PRN

## 2015-02-25 NOTE — Patient Instructions (Addendum)
Great to meet you!  Try zofran for nausea Get plenty of fluids    Viral Gastroenteritis Viral gastroenteritis is also called stomach flu. This illness is caused by a certain type of germ (virus). It can cause sudden watery poop (diarrhea) and throwing up (vomiting). This can cause you to lose body fluids (dehydration). This illness usually lasts for 3 to 8 days. It usually goes away on its own. HOME CARE   Drink enough fluids to keep your pee (urine) clear or pale yellow. Drink small amounts of fluids often.  Ask your doctor how to replace body fluid losses (rehydration).  Avoid:  Foods high in sugar.  Alcohol.  Bubbly (carbonated) drinks.  Tobacco.  Juice.  Caffeine drinks.  Very hot or cold fluids.  Fatty, greasy foods.  Eating too much at one time.  Dairy products until 24 to 48 hours after your watery poop stops.  You may eat foods with active cultures (probiotics). They can be found in some yogurts and supplements.  Wash your hands well to avoid spreading the illness.  Only take medicines as told by your doctor. Do not give aspirin to children. Do not take medicines for watery poop (antidiarrheals).  Ask your doctor if you should keep taking your regular medicines.  Keep all doctor visits as told. GET HELP RIGHT AWAY IF:   You cannot keep fluids down.  You do not pee at least once every 6 to 8 hours.  You are short of breath.  You see blood in your poop or throw up. This may look like coffee grounds.  You have belly (abdominal) pain that gets worse or is just in one small spot (localized).  You keep throwing up or having watery poop.  You have a fever.  The patient is a child younger than 3 months, and he or she has a fever.  The patient is a child older than 3 months, and he or she has a fever and problems that do not go away.  The patient is a child older than 3 months, and he or she has a fever and problems that suddenly get worse.  The  patient is a baby, and he or she has no tears when crying. MAKE SURE YOU:   Understand these instructions.  Will watch your condition.  Will get help right away if you are not doing well or get worse.   This information is not intended to replace advice given to you by your health care provider. Make sure you discuss any questions you have with your health care provider.   Document Released: 09/12/2007 Document Revised: 06/18/2011 Document Reviewed: 01/10/2011 Elsevier Interactive Patient Education [employer]

## 2015-02-25 NOTE — Progress Notes (Signed)
   HPI  Patient presents today with  vomiting and diarrhea.  Patient denies that she's had a bout 10-12 hours of subjective fever, emesis with stomach contents, and frequent diarrhea. She also has upset stomach, cough, runny nose, and central chest tightness with deep inspiration. She has no shortness of breath or true chest pain. She states that she has rhinorrhea and throat at baseline. Her highest measured fever is been 100.0.  Her child also has a similar illness. She has missed work today and thinks that she will be eliminated in tomorrow.  PMH: Smoking status noted ROS: Per HPI  Objective: BP 124/76 mmHg  Pulse 86  Temp(Src) 97.1 F (36.2 C) (Oral)  Ht 5' 2.5" (1.588 m)  Wt 225 lb 6.4 oz (102.241 kg)  BMI 40.54 kg/m2 Gen: NAD, alert, cooperative with exam HEENT: NCAT, oropharynx clear with MMM, nares with some of the turbinates. CV good S1/S2, no murmur Resp: CTABL, no wheezes, non-labored Abd: SNTND, BS present, no guarding or organomegaly Ext: No edema, warm Neuro: Alert and oriented, No gross deficits  Assessment and plan:  viral gastroenteritis Given Zofran for nausea, recommended fluids She appears well-hydrated and is in no acute distress. Return if worsening or does not resolve.  # Postnasal drip This is a more long-standing problem that her current illness. Recommended daily Claritin or Zyrtec Also offered Flonase which she declines.    Meds ordered this encounter  Medications  . ondansetron (ZOFRAN ODT) 4 MG disintegrating tablet    Sig: Take 1 tablet (4 mg total) by mouth every 8 (eight) hours as needed for nausea or vomiting.    Dispense:  20 tablet    Refill:  0    Murtis SinkSam Vicky Mccanless, MD [facility] 02/25/2015, 11:08 AM

## 2015-04-22 ENCOUNTER — Ambulatory Visit (INDEPENDENT_AMBULATORY_CARE_PROVIDER_SITE_OTHER): Payer: Medicaid Other | Admitting: Family Medicine

## 2015-04-22 ENCOUNTER — Ambulatory Visit: Payer: Medicaid Other | Admitting: Family Medicine

## 2015-04-22 ENCOUNTER — Encounter: Payer: Self-pay | Admitting: Family Medicine

## 2015-04-22 VITALS — BP 116/73 | HR 85 | Temp 97.4°F | Ht 62.5 in | Wt 228.6 lb

## 2015-04-22 DIAGNOSIS — Z3201 Encounter for pregnancy test, result positive: Secondary | ICD-10-CM

## 2015-04-22 DIAGNOSIS — Z3200 Encounter for pregnancy test, result unknown: Secondary | ICD-10-CM | POA: Diagnosis not present

## 2015-04-22 LAB — POCT URINE PREGNANCY: Preg Test, Ur: POSITIVE — AB

## 2015-04-22 MED ORDER — PRENATAL VITAMINS 28-0.8 MG PO TABS: 1.0000 | ORAL_TABLET | Freq: Every day | ORAL | Status: DC

## 2015-04-22 NOTE — Progress Notes (Signed)
BP 116/73 mmHg  Pulse 85  Temp(Src) 97.4 F (36.3 C) (Oral)  Ht 5' 2.5" (1.588 m)  Wt 228 lb 9.6 oz (103.692 kg)  BMI 41.12 kg/m2  LMP 04/06/2015 (Approximate)   Subjective:    Patient ID: Kristin Morgan, female    DOB: 1995/11/05, 20 y.o.   MRN: [id number]  HPI: Kristin Morgan is a 20 y.o. female presenting on 04/22/2015 for Possible Pregnancy   HPI Positive home urine pregnancy test Patient has done 2 or 3 positive home urine pregnancy test and is coming in today for confirmation of urine pregnancy and for referral to an OB/GYN. She denies any abdominal pain or cramping or vaginal bleeding or discharge. She is in a stable relationship with her significant other has had one previous vaginal delivery. She denies any nausea or vomiting yet at this point. She has not started taking prenatal vitamins yet.  Relevant past medical, surgical, family and social history reviewed and updated as indicated. Interim medical history since our last visit reviewed. Allergies and medications reviewed and updated.  Review of Systems  Constitutional: Negative for fever and chills.  HENT: Negative for congestion, ear discharge and ear pain.   Eyes: Negative for redness and visual disturbance.  Respiratory: Negative for chest tightness and shortness of breath.   Cardiovascular: Negative for chest pain and leg swelling.  Gastrointestinal: Negative for abdominal pain.  Genitourinary: Negative for dysuria, hematuria, decreased urine volume, vaginal bleeding, vaginal discharge and difficulty urinating.  Musculoskeletal: Negative for back pain and gait problem.  Skin: Negative for rash.  Neurological: Negative for light-headedness and headaches.  Psychiatric/Behavioral: Negative for behavioral problems and agitation.  All other systems reviewed and are negative.   Per HPI unless specifically indicated above     Medication List       This list is accurate as of: 04/22/15 10:02 AM.  Always use your most  recent med list.               Prenatal Vitamins 28-0.8 MG Tabs  Take 1 tablet by mouth daily.           Objective:    BP 116/73 mmHg  Pulse 85  Temp(Src) 97.4 F (36.3 C) (Oral)  Ht 5' 2.5" (1.588 m)  Wt 228 lb 9.6 oz (103.692 kg)  BMI 41.12 kg/m2  LMP 04/06/2015 (Approximate)  Wt Readings from Last 3 Encounters:  04/22/15 228 lb 9.6 oz (103.692 kg)  02/25/15 225 lb 6.4 oz (102.241 kg)  01/21/15 225 lb 12.8 oz (102.422 kg)    Physical Exam  Constitutional: She is oriented to person, place, and time. She appears well-developed and well-nourished. No distress.  Eyes: Conjunctivae and EOM are normal. Pupils are equal, round, and reactive to light.  Cardiovascular: Normal rate, regular rhythm, normal heart sounds and intact distal pulses.   No murmur heard. Pulmonary/Chest: Effort normal and breath sounds normal. No respiratory distress. She has no wheezes.  Abdominal: Soft. Bowel sounds are normal. She exhibits no distension. There is no tenderness. There is no rebound.  Musculoskeletal: Normal range of motion. She exhibits no edema or tenderness.  Neurological: She is alert and oriented to person, place, and time. Coordination normal.  Skin: Skin is warm and dry. No rash noted. She is not diaphoretic.  Psychiatric: She has a normal mood and affect. Her behavior is normal.  Nursing note and vitals reviewed.   Results for orders placed or performed in visit on 04/22/15  POCT urine pregnancy  Result  Value Ref Range   Preg Test, Ur Positive (A) Negative      Assessment & Plan:   Problem List Items Addressed This Visit    None    Visit Diagnoses    Possible pregnancy    -  Primary    Relevant Medications    Prenatal Vit-Fe Fumarate-FA (PRENATAL VITAMINS) 28-0.8 MG TABS    Other Relevant Orders    POCT urine pregnancy (Completed)    Positive urine pregnancy test        Relevant Medications    Prenatal Vit-Fe Fumarate-FA (PRENATAL VITAMINS) 28-0.8 MG TABS     Other Relevant Orders    Ambulatory referral to Obstetrics / Gynecology        Follow up plan: Return if symptoms worsen or fail to improve.  Counseling provided for all of the vaccine components Orders Placed This Encounter  Procedures  . Ambulatory referral to Obstetrics / Gynecology  . POCT urine pregnancy    Arville Care, MD [facility] 04/22/2015, 10:02 AM

## 2015-04-26 ENCOUNTER — Telehealth: Payer: Self-pay | Admitting: Family

## 2015-04-26 NOTE — Telephone Encounter (Signed)
Referral cancelled at pt's request

## 2015-05-07 ENCOUNTER — Ambulatory Visit (INDEPENDENT_AMBULATORY_CARE_PROVIDER_SITE_OTHER): Payer: Medicaid Other | Admitting: Nurse Practitioner

## 2015-05-07 VITALS — BP 115/71 | HR 106 | Temp 97.6°F | Ht 62.5 in | Wt 232.8 lb

## 2015-05-07 DIAGNOSIS — J0101 Acute recurrent maxillary sinusitis: Secondary | ICD-10-CM | POA: Diagnosis not present

## 2015-05-07 MED ORDER — AZITHROMYCIN 500 MG PO TABS: ORAL_TABLET | ORAL | Status: DC

## 2015-05-07 NOTE — Patient Instructions (Signed)

## 2015-05-07 NOTE — Progress Notes (Signed)
  Subjective:     Kristin Morgan is a 20 y.o. female here for evaluation of a cough. Onset of symptoms was 1 day ago. Symptoms have been rapidly worsening since that time. The cough is barky, hoarse and productive and is aggravated by nothing. Associated symptoms include: change in voice, chills, fever and sputum production. Patient does not have a history of asthma. Patient does not have a history of environmental allergens. Patient has not traveled recently. Patient does not have a history of smoking. Patient has not had a previous chest x-ray. Patient has not had a PPD done.  The following portions of the patient's history were reviewed and updated as appropriate: allergies, current medications, past family history, past medical history, past social history, past surgical history and problem list.  Review of Systems Pertinent items are noted in HPI.    Objective:     BP 115/71 mmHg  Pulse 106  Temp(Src) 97.6 F (36.4 C) (Oral)  Ht 5' 2.5" (1.588 m)  Wt 232 lb 12.8 oz (105.597 kg)  BMI 41.87 kg/m2  LMP 04/06/2015 (Approximate) General appearance: alert and cooperative Eyes: conjunctivae/corneas clear. PERRL, EOM's intact. Fundi benign. Ears: normal TM's and external ear canals both ears Nose: green discharge, moderate congestion, turbinates red maxillary sinus tenderness bil Throat: lips, mucosa, and tongue normal; teeth and gums normal Neck: no adenopathy, no carotid bruit, no JVD, supple, symmetrical, trachea midline and thyroid not enlarged, symmetric, no tenderness/mass/nodules Lungs: clear to auscultation bilaterally Heart: regular rate and rhythm, S1, S2 normal, no murmur, click, rub or gallop    Assessment:    Sinusitis    Plan:   1. Take meds as prescribed 2. Use a cool mist humidifier especially during the winter months and when heat has been humid. 3. Use saline nose sprays frequently 4. Saline irrigations of the nose can be very helpful if done frequently.  * 4X daily  for 1 week*  * Use of a nettie pot can be helpful with this. Follow directions with this* 5. Drink plenty of fluids 6. Keep thermostat turn down low 7.For any cough or congestion  Use plain Mucinex- regular strength or max strength is fine   * Children- consult with Pharmacist for dosing 8. For fever or aces or pains- take tylenol or ibuprofen appropriate for age and weight.  * for fevers greater than 101 orally you may alternate ibuprofen and tylenol every  3 hours.    Meds ordered this encounter  Medications  . azithromycin (ZITHROMAX) 500 MG tablet    Sig: As directed    Dispense:  6 tablet    Refill:  0    Order Specific Question:  Supervising Provider    Answer:  Ernestina Penna [[id number]]   Mary-Margaret Daphine Deutscher, FNP

## 2015-05-10 ENCOUNTER — Telehealth: Payer: Self-pay | Admitting: Family

## 2015-05-10 NOTE — Telephone Encounter (Signed)
Patient states that her cough, ear pain, and congestion has not improved. Patient would like to know what she can do now.

## 2015-05-10 NOTE — Telephone Encounter (Signed)
Unfortunately this is going to last awhile and all you can do is just treat symptoms- the coaugh may actually  Last several weeks. OTC cough meds are about all you can do.

## 2015-05-10 NOTE — Telephone Encounter (Signed)
Detailed message left for patient at her request with what Paulene Floor suggested.

## 2015-05-19 ENCOUNTER — Encounter: Payer: Self-pay | Admitting: Nurse Practitioner

## 2015-05-23 ENCOUNTER — Encounter: Payer: Self-pay | Admitting: Family Medicine

## 2015-05-23 ENCOUNTER — Ambulatory Visit (INDEPENDENT_AMBULATORY_CARE_PROVIDER_SITE_OTHER): Payer: Medicaid Other | Admitting: Family Medicine

## 2015-05-23 VITALS — BP 122/69 | HR 91 | Temp 97.3°F | Ht 62.5 in | Wt 234.2 lb

## 2015-05-23 DIAGNOSIS — H6591 Unspecified nonsuppurative otitis media, right ear: Secondary | ICD-10-CM | POA: Diagnosis not present

## 2015-05-23 MED ORDER — AMOXICILLIN 875 MG PO TABS: 875.0000 mg | ORAL_TABLET | Freq: Two times a day (BID) | ORAL | Status: DC

## 2015-05-23 MED ORDER — FLUTICASONE PROPIONATE 50 MCG/ACT NA SUSP: 1.0000 | Freq: Two times a day (BID) | NASAL | Status: DC | PRN

## 2015-05-23 NOTE — Progress Notes (Signed)
BP 122/69 mmHg  Pulse 91  Temp(Src) 97.3 F (36.3 C) (Oral)  Ht 5' 2.5" (1.588 m)  Wt 234 lb 3.2 oz (106.232 kg)  BMI 42.13 kg/m2  LMP 04/06/2015 (Approximate)   Subjective:    Patient ID: Kristin Morgan, female    DOB: 05-08-1995, 20 y.o.   MRN: [id number]  HPI: Kristin Morgan is a 20 y.o. female presenting on 05/23/2015 for Sinusitis   HPI Right ear pain and sinus congestion Patient presents today because she had persistent sore throat and sinus congestion and drainage and now has developed right ear pain. She was given azithromycin a couple weeks ago when he got better but then it came right back. The ear pain is a new and she did not have that before. She denies any fevers or chills or shortness of breath or wheezing. She has not been using anything else besides antibiotic to help with this. She does not know of any sick contacts.  Relevant past medical, surgical, family and social history reviewed and updated as indicated. Interim medical history since our last visit reviewed. Allergies and medications reviewed and updated.  Review of Systems  Constitutional: Negative for fever and chills.  HENT: Positive for congestion, ear pain, postnasal drip, rhinorrhea, sinus pressure, sneezing and sore throat. Negative for ear discharge.   Eyes: Negative for pain, redness and visual disturbance.  Respiratory: Positive for cough. Negative for chest tightness and shortness of breath.   Cardiovascular: Negative for chest pain and leg swelling.  Genitourinary: Negative for dysuria and difficulty urinating.  Musculoskeletal: Negative for back pain and gait problem.  Skin: Negative for rash.  Neurological: Negative for light-headedness and headaches.  Psychiatric/Behavioral: Negative for behavioral problems and agitation.  All other systems reviewed and are negative.   Per HPI unless specifically indicated above     Medication List       This list is accurate as of: 05/23/15 12:54 PM.   Always use your most recent med list.               amoxicillin 875 MG tablet  Commonly known as:  AMOXIL  Take 1 tablet (875 mg total) by mouth 2 (two) times daily.     fluticasone 50 MCG/ACT nasal spray  Commonly known as:  FLONASE  Place 1 spray into both nostrils 2 (two) times daily as needed for allergies or rhinitis.     Prenatal Vitamins 28-0.8 MG Tabs  Take 1 tablet by mouth daily.           Objective:    BP 122/69 mmHg  Pulse 91  Temp(Src) 97.3 F (36.3 C) (Oral)  Ht 5' 2.5" (1.588 m)  Wt 234 lb 3.2 oz (106.232 kg)  BMI 42.13 kg/m2  LMP 04/06/2015 (Approximate)  Wt Readings from Last 3 Encounters:  05/23/15 234 lb 3.2 oz (106.232 kg)  05/07/15 232 lb 12.8 oz (105.597 kg)  04/22/15 228 lb 9.6 oz (103.692 kg)    Physical Exam  Constitutional: She is oriented to person, place, and time. She appears well-developed and well-nourished. No distress.  HENT:  Right Ear: External ear and ear canal normal. No mastoid tenderness. Tympanic membrane is injected, erythematous and bulging. Tympanic membrane is not scarred, not perforated and not retracted. A middle ear effusion is present.  Left Ear: Tympanic membrane, external ear and ear canal normal.  Nose: Mucosal edema and rhinorrhea present. No epistaxis. Right sinus exhibits no maxillary sinus tenderness and no frontal sinus tenderness. Left sinus exhibits  no maxillary sinus tenderness and no frontal sinus tenderness.  Mouth/Throat: Uvula is midline and mucous membranes are normal. Posterior oropharyngeal edema and posterior oropharyngeal erythema present. No oropharyngeal exudate or tonsillar abscesses.  Eyes: Conjunctivae and EOM are normal.  Cardiovascular: Normal rate, regular rhythm, normal heart sounds and intact distal pulses.   No murmur heard. Pulmonary/Chest: Effort normal and breath sounds normal. No respiratory distress. She has no wheezes.  Musculoskeletal: Normal range of motion. She exhibits no edema or  tenderness.  Neurological: She is alert and oriented to person, place, and time. Coordination normal.  Skin: Skin is warm and dry. No rash noted. She is not diaphoretic.  Psychiatric: She has a normal mood and affect. Her behavior is normal.  Vitals reviewed.     Assessment & Plan:   Problem List Items Addressed This Visit    None    Visit Diagnoses    Right otitis media with effusion    -  Primary    Relevant Medications    amoxicillin (AMOXIL) 875 MG tablet    fluticasone (FLONASE) 50 MCG/ACT nasal spray        Follow up plan: Return if symptoms worsen or fail to improve.  Counseling provided for all of the vaccine components No orders of the defined types were placed in this encounter.    Arville Care, MD [facility] 05/23/2015, 12:54 PM

## 2015-05-30 ENCOUNTER — Telehealth: Payer: Self-pay | Admitting: Pediatrics

## 2015-05-30 DIAGNOSIS — B379 Candidiasis, unspecified: Secondary | ICD-10-CM

## 2015-05-30 MED ORDER — FLUCONAZOLE 150 MG PO TABS: 150.0000 mg | ORAL_TABLET | Freq: Once | ORAL | Status: DC

## 2015-05-30 NOTE — Telephone Encounter (Signed)
Please let pt know--now sent in

## 2015-05-30 NOTE — Telephone Encounter (Signed)
Pt aware rx was sent and receipt confirmed by pharmacy at 3:12 pm.

## 2016-03-17 ENCOUNTER — Ambulatory Visit: Payer: Medicaid Other

## 2016-03-20 ENCOUNTER — Telehealth: Payer: Self-pay | Admitting: Family

## 2016-03-20 NOTE — Telephone Encounter (Signed)
Aware to call urgent care to get a new medication.

## 2016-07-30 ENCOUNTER — Encounter: Payer: Self-pay | Admitting: Family

## 2016-07-30 ENCOUNTER — Ambulatory Visit (INDEPENDENT_AMBULATORY_CARE_PROVIDER_SITE_OTHER): Payer: Medicaid Other | Admitting: Family

## 2016-07-30 VITALS — BP 122/65 | HR 91 | Temp 97.5°F | Ht 62.5 in | Wt 242.0 lb

## 2016-07-30 DIAGNOSIS — Z6841 Body Mass Index (BMI) 40.0 and over, adult: Secondary | ICD-10-CM

## 2016-07-30 DIAGNOSIS — Z309 Encounter for contraceptive management, unspecified: Secondary | ICD-10-CM

## 2016-07-30 LAB — PREGNANCY, URINE: Preg Test, Ur: NEGATIVE

## 2016-07-30 MED ORDER — NORGESTIMATE-ETH ESTRADIOL 0.25-35 MG-MCG PO TABS: 1.0000 | ORAL_TABLET | Freq: Every day | ORAL | 4 refills | Status: DC

## 2016-07-30 NOTE — Progress Notes (Signed)
   Subjective:    Patient ID: Kristin Morgan, female    DOB: 08-Dec-1995, 21 y.o.   MRN: [id number]  HPI Pt presents to the office today to discuss birth control. PT has two children at this time. PT is sexually active. PT's last menses was 07/29/16.  PT is complaining of acne that has flared up over the last month. Pt has tried OTC with no relief.    Review of Systems  All other systems reviewed and are negative.      Objective:   Physical Exam  Constitutional: She is oriented to person, place, and time. She appears well-developed and well-nourished. No distress.  Cardiovascular: Normal rate, regular rhythm, normal heart sounds and intact distal pulses.   No murmur heard. Pulmonary/Chest: Effort normal and breath sounds normal. No respiratory distress. She has no wheezes.  Abdominal: Soft. Bowel sounds are normal. She exhibits no distension. There is no tenderness.  Musculoskeletal: Normal range of motion. She exhibits no edema or tenderness.  Neurological: She is alert and oriented to person, place, and time.  Skin: Skin is warm and dry.  Psychiatric: She has a normal mood and affect. Her behavior is normal. Judgment and thought content normal.  Vitals reviewed.     BP 122/65   Pulse 91   Temp 97.5 F (36.4 C) (Oral)   Ht 5' 2.5" (1.588 m)   Wt 242 lb (109.8 kg)   LMP 07/29/2016   Breastfeeding? Unknown   BMI 43.56 kg/m      Assessment & Plan:  1. Encounter for contraceptive management, unspecified type -Safe sex discussed -Discussed to start taking today, but need to use backup contraceptive protection for the first week. - Pregnancy, urine - norgestimate-ethinyl estradiol (SPRINTEC 28) 0.25-35 MG-MCG tablet; Take 1 tablet by mouth daily.  Dispense: 3 Package; Refill: 4  2. Morbid obesity with BMI of 40.0-44.9, adult (HCC)   Jannifer Rodney, FNP

## 2016-07-30 NOTE — Patient Instructions (Signed)
Oral Contraception Use Oral contraceptive pills (OCPs) are medicines taken to prevent pregnancy. OCPs work by preventing the ovaries from releasing eggs. The hormones in OCPs also cause the cervical mucus to thicken, preventing the sperm from entering the uterus. The hormones also cause the uterine lining to become thin, not allowing a fertilized egg to attach to the inside of the uterus. OCPs are highly effective when taken exactly as prescribed. However, OCPs do not prevent sexually transmitted diseases (STDs). Safe sex practices, such as using condoms along with an OCP, can help prevent STDs. Before taking OCPs, you may have a physical exam and Pap test. Your health care provider may also order blood tests if necessary. Your health care provider will make sure you are a good candidate for oral contraception. Discuss with your health care provider the possible side effects of the OCP you may be prescribed. When starting an OCP, it can take 2 to 3 months for the body to adjust to the changes in hormone levels in your body. How to take oral contraceptive pills Your health care provider may advise you on how to start taking the first cycle of OCPs. Otherwise, you can:  Start on day 1 of your menstrual period. You will not need any backup contraceptive protection with this start time.  Start on the first Sunday after your menstrual period or the day you get your prescription. In these cases, you will need to use backup contraceptive protection for the first week.  Start the pill at any time of your cycle. If you take the pill within 5 days of the start of your period, you are protected against pregnancy right away. In this case, you will not need a backup form of birth control. If you start at any other time of your menstrual cycle, you will need to use another form of birth control for 7 days. If your OCP is the type called a minipill, it will protect you from pregnancy after taking it for 2 days (48  hours).  After you have started taking OCPs:  If you forget to take 1 pill, take it as soon as you remember. Take the next pill at the regular time.  If you miss 2 or more pills, call your health care provider because different pills have different instructions for missed doses. Use backup birth control until your next menstrual period starts.  If you use a 28-day pack that contains inactive pills and you miss 1 of the last 7 pills (pills with no hormones), it will not matter. Throw away the rest of the non-hormone pills and start a new pill pack.  No matter which day you start the OCP, you will always start a new pack on that same day of the week. Have an extra pack of OCPs and a backup contraceptive method available in case you miss some pills or lose your OCP pack. Follow these instructions at home:  Do not smoke.  Always use a condom to protect against STDs. OCPs do not protect against STDs.  Use a calendar to mark your menstrual period days.  Read the information and directions that came with your OCP. Talk to your health care provider if you have questions. Contact a health care provider if:  You develop nausea and vomiting.  You have abnormal vaginal discharge or bleeding.  You develop a rash.  You miss your menstrual period.  You are losing your hair.  You need treatment for mood swings or depression.  You   get dizzy when taking the OCP.  You develop acne from taking the OCP.  You become pregnant. Get help right away if:  You develop chest pain.  You develop shortness of breath.  You have an uncontrolled or severe headache.  You develop numbness or slurred speech.  You develop visual problems.  You develop pain, redness, and swelling in the legs. This information is not intended to replace advice given to you by your health care provider. Make sure you discuss any questions you have with your health care provider. Document Released: 03/15/2011 Document  Revised: 09/01/2015 Document Reviewed: 09/14/2012 Elsevier Interactive Patient Education  2017 [employer]  

## 2018-06-02 ENCOUNTER — Encounter: Payer: Self-pay | Admitting: Family

## 2018-06-02 ENCOUNTER — Ambulatory Visit (INDEPENDENT_AMBULATORY_CARE_PROVIDER_SITE_OTHER): Payer: Medicaid Other | Admitting: Family

## 2018-06-02 VITALS — BP 117/65 | HR 85 | Temp 98.0°F | Ht 62.5 in | Wt 242.4 lb

## 2018-06-02 DIAGNOSIS — R05 Cough: Secondary | ICD-10-CM

## 2018-06-02 DIAGNOSIS — J101 Influenza due to other identified influenza virus with other respiratory manifestations: Secondary | ICD-10-CM | POA: Diagnosis not present

## 2018-06-02 DIAGNOSIS — R059 Cough, unspecified: Secondary | ICD-10-CM

## 2018-06-02 LAB — RAPID STREP SCREEN (MED CTR MEBANE ONLY): Strep Gp A Ag, IA W/Reflex: NEGATIVE

## 2018-06-02 LAB — VERITOR FLU A/B WAIVED
INFLUENZA A: POSITIVE — AB
INFLUENZA B: NEGATIVE

## 2018-06-02 LAB — CULTURE, GROUP A STREP

## 2018-06-02 NOTE — Patient Instructions (Signed)

## 2018-06-02 NOTE — Progress Notes (Signed)
   Subjective:    Patient ID: Kristin Morgan, female    DOB: [birth year], 23 y.o.   MRN: [id number]  Chief Complaint  Patient presents with  . cough and congestion    Cough  This is a new problem. The current episode started in the past 7 days. The problem has been unchanged. The problem occurs every few minutes. The cough is non-productive. Associated symptoms include chills, ear congestion, ear pain, a fever, headaches, myalgias, nasal congestion, postnasal drip, rhinorrhea and a sore throat. The symptoms are aggravated by lying down. She has tried rest and OTC cough suppressant for the symptoms. The treatment provided mild relief.      Review of Systems  Constitutional: Positive for chills and fever.  HENT: Positive for ear pain, postnasal drip, rhinorrhea and sore throat.   Respiratory: Positive for cough.   Musculoskeletal: Positive for myalgias.  Neurological: Positive for headaches.  All other systems reviewed and are negative.      Objective:   Physical Exam Vitals signs reviewed.  Constitutional:      General: She is not in acute distress.    Appearance: She is well-developed.  HENT:     Head: Normocephalic and atraumatic.     Right Ear: Tympanic membrane normal.     Left Ear: Tympanic membrane normal.     Nose: Mucosal edema and rhinorrhea present.     Mouth/Throat:     Pharynx: Posterior oropharyngeal erythema present.  Eyes:     Pupils: Pupils are equal, round, and reactive to light.  Neck:     Musculoskeletal: Normal range of motion and neck supple.     Thyroid: No thyromegaly.  Cardiovascular:     Rate and Rhythm: Normal rate and regular rhythm.     Heart sounds: Normal heart sounds. No murmur.  Pulmonary:     Effort: Pulmonary effort is normal. No respiratory distress.     Breath sounds: Normal breath sounds. No wheezing.     Comments: Intermittent nonproductive cough Abdominal:     General: Bowel sounds are normal. There is no distension.     Palpations:  Abdomen is soft.     Tenderness: There is no abdominal tenderness.  Musculoskeletal: Normal range of motion.        General: No tenderness.  Skin:    General: Skin is warm and dry.  Neurological:     Mental Status: She is alert and oriented to person, place, and time.     Cranial Nerves: No cranial nerve deficit.     Deep Tendon Reflexes: Reflexes are normal and symmetric.  Psychiatric:        Behavior: Behavior normal.        Thought Content: Thought content normal.        Judgment: Judgment normal.       BP 117/65   Pulse 85   Temp 98 F (36.7 C) (Oral)   Ht 5' 2.5" (1.588 m)   Wt 242 lb 6.4 oz (110 kg)   BMI 43.63 kg/m      Assessment & Plan:  Charlet Thornes comes in today with chief complaint of cough and congestion   Diagnosis and orders addressed:  1. Cough - Rapid Strep Screen ([facility] ONLY) - Veritor Flu A/B Waived  2. Influenza A Rest Force fluids Tylenol or Motrin  Droplet precautions discussed  RTO if symptoms worsen or do not improve    Jannifer Rodney, FNP

## 2018-11-17 ENCOUNTER — Encounter: Payer: Self-pay | Admitting: Physician Assistant

## 2018-11-17 ENCOUNTER — Ambulatory Visit (INDEPENDENT_AMBULATORY_CARE_PROVIDER_SITE_OTHER): Payer: Medicaid Other | Admitting: Physician Assistant

## 2018-11-17 DIAGNOSIS — J0110 Acute frontal sinusitis, unspecified: Secondary | ICD-10-CM

## 2018-11-17 MED ORDER — FLUCONAZOLE 150 MG PO TABS: 150.0000 mg | ORAL_TABLET | Freq: Once | ORAL | 0 refills | Status: AC

## 2018-11-17 MED ORDER — AMOXICILLIN 500 MG PO CAPS: 500.0000 mg | ORAL_CAPSULE | Freq: Three times a day (TID) | ORAL | 0 refills | Status: DC

## 2018-11-17 NOTE — Progress Notes (Signed)
10:16      Telephone visit  Subjective: [id number] PCP: Sharion Balloon, FNP AQT:[id number] Runnels is a 23 y.o. female calls for telephone consult today. Patient provides verbal consent for consult held via phone.  Patient is identified with 2 separate identifiers.  At this time the entire area is on COVID-19 social distancing and stay home orders are in place.  Patient is of higher risk and therefore we are performing this by a virtual method.  Location of patient: home Location of provider: [facility] Others present for call: no   This patient has had many days of sinus headache and postnasal drainage. There is copious drainage at times. Denies any fever at this time. There has been a history of sinus infections in the past.  No history of sinus surgery. There is cough at night. It has become more prevalent in recent days.    ROS: Per HPI  Allergies  Allergen Reactions  . Aspirin Nausea And Vomiting  . Sulfa Antibiotics Hives   Past Medical History:  Diagnosis Date  . Depression   . Pregnant     Current Outpatient Medications:  .  amoxicillin (AMOXIL) 500 MG capsule, Take 1 capsule (500 mg total) by mouth 3 (three) times daily., Disp: 30 capsule, Rfl: 0 .  fluconazole (DIFLUCAN) 150 MG tablet, Take 1 tablet (150 mg total) by mouth once for 1 dose., Disp: 1 tablet, Rfl: 0 .  norgestimate-ethinyl estradiol (SPRINTEC 28) 0.25-35 MG-MCG tablet, Take 1 tablet by mouth daily. (Patient not taking: Reported on 06/02/2018), Disp: 3 Package, Rfl: 4  Assessment/ Plan: 23 y.o. female   1. Acute non-recurrent frontal sinusitis - fluconazole (DIFLUCAN) 150 MG tablet; Take 1 tablet (150 mg total) by mouth once for 1 dose.  Dispense: 1 tablet; Refill: 0 - amoxicillin (AMOXIL) 500 MG capsule; Take 1 capsule (500 mg total) by mouth 3 (three) times daily.  Dispense: 30 capsule; Refill: 0   No follow-ups on file.  Continue all other maintenance medications as listed above.  Start time: 10:10  AM End time: 10:18 AM  Meds ordered this encounter  Medications  . fluconazole (DIFLUCAN) 150 MG tablet    Sig: Take 1 tablet (150 mg total) by mouth once for 1 dose.    Dispense:  1 tablet    Refill:  0    Order Specific Question:   Supervising Provider    Answer:   Janora Norlander [[id number]]  . amoxicillin (AMOXIL) 500 MG capsule    Sig: Take 1 capsule (500 mg total) by mouth 3 (three) times daily.    Dispense:  30 capsule    Refill:  0    Order Specific Question:   Supervising Provider    Answer:   Janora Norlander [[id number]]    Particia Nearing PA-C [place] [phone]

## 2018-12-09 HISTORY — PX: WISDOM TOOTH EXTRACTION: SHX21

## 2019-02-23 ENCOUNTER — Encounter: Payer: Self-pay | Admitting: Family Medicine

## 2019-02-23 ENCOUNTER — Ambulatory Visit (INDEPENDENT_AMBULATORY_CARE_PROVIDER_SITE_OTHER): Payer: Medicaid Other | Admitting: Family Medicine

## 2019-02-23 ENCOUNTER — Other Ambulatory Visit: Payer: Self-pay

## 2019-02-23 DIAGNOSIS — H66001 Acute suppurative otitis media without spontaneous rupture of ear drum, right ear: Secondary | ICD-10-CM | POA: Diagnosis not present

## 2019-02-23 DIAGNOSIS — Z8742 Personal history of other diseases of the female genital tract: Secondary | ICD-10-CM | POA: Diagnosis not present

## 2019-02-23 DIAGNOSIS — J069 Acute upper respiratory infection, unspecified: Secondary | ICD-10-CM

## 2019-02-23 MED ORDER — FLUTICASONE PROPIONATE 50 MCG/ACT NA SUSP: 2.0000 | Freq: Every day | NASAL | 6 refills | Status: DC

## 2019-02-23 MED ORDER — AMOXICILLIN-POT CLAVULANATE 875-125 MG PO TABS: 1.0000 | ORAL_TABLET | Freq: Two times a day (BID) | ORAL | 0 refills | Status: AC

## 2019-02-23 MED ORDER — PSEUDOEPH-BROMPHEN-DM 30-2-10 MG/5ML PO SYRP: 5.0000 mL | ORAL_SOLUTION | Freq: Four times a day (QID) | ORAL | 0 refills | Status: DC | PRN

## 2019-02-23 MED ORDER — FLUCONAZOLE 150 MG PO TABS: ORAL_TABLET | ORAL | 0 refills | Status: DC

## 2019-02-23 NOTE — Progress Notes (Signed)
Virtual Visit via telephone Note Due to COVID-19 pandemic this visit was conducted virtually. This visit type was conducted due to national recommendations for restrictions regarding the COVID-19 Pandemic (e.g. social distancing, sheltering in place) in an effort to limit this patient's exposure and mitigate transmission in our community. All issues noted in this document were discussed and addressed.  A physical exam was not performed with this format.   I connected with Kristin Morgan on 02/23/2019 at 1410 by telephone and verified that I am speaking with the correct person using two identifiers. Kristin Morgan is currently located at home and family is currently with them during visit. The provider, Kari Baars, FNP is located in their office at time of visit.  I discussed the limitations, risks, security and privacy concerns of performing an evaluation and management service by telephone and the availability of in person appointments. I also discussed with the patient that there may be a patient responsible charge related to this service. The patient expressed understanding and agreed to proceed.  Subjective:  Patient ID: Kristin Morgan, female    DOB: [birth year], 23 y.o.   MRN: [id number]  Chief Complaint:  Otalgia   HPI: Kristin Morgan is a 23 y.o. female presenting on 02/23/2019 for Otalgia   Otalgia  There is pain in the right ear. This is a new problem. The current episode started 1 to 4 weeks ago. The problem occurs constantly. The problem has been gradually worsening. The maximum temperature recorded prior to her arrival was 100.4 - 100.9 F. The pain is at a severity of 5/10. The pain is moderate. Associated symptoms include coughing, headaches, rhinorrhea and a sore throat. Pertinent negatives include no abdominal pain, diarrhea, ear discharge, hearing loss, neck pain, rash or vomiting. She has tried acetaminophen for the symptoms. The treatment provided no relief.  URI  This is a new problem.  The current episode started 1 to 4 weeks ago. The problem has been gradually worsening. Associated symptoms include congestion, coughing, ear pain, headaches, a plugged ear sensation, rhinorrhea, sinus pain, sneezing, a sore throat and swollen glands. Pertinent negatives include no abdominal pain, chest pain, diarrhea, dysuria, joint pain, joint swelling, nausea, neck pain, rash, vomiting or wheezing. She has tried acetaminophen for the symptoms. The treatment provided no relief.     Relevant past medical, surgical, family, and social history reviewed and updated as indicated.  Allergies and medications reviewed and updated.   Past Medical History:  Diagnosis Date  . Depression   . Pregnant     History reviewed. No pertinent surgical history.  Social History   Socioeconomic History  . Marital status: Single    Spouse name: Not on file  . Number of children: Not on file  . Years of education: Not on file  . Highest education level: Not on file  Occupational History  . Not on file  Social Needs  . Financial resource strain: Not on file  . Food insecurity    Worry: Not on file    Inability: Not on file  . Transportation needs    Medical: Not on file    Non-medical: Not on file  Tobacco Use  . Smoking status: Never Smoker  . Smokeless tobacco: Never Used  Substance and Sexual Activity  . Alcohol use: No  . Drug use: No  . Sexual activity: Yes    Birth control/protection: None  Lifestyle  . Physical activity    Days per week: Not on file  Minutes per session: Not on file  . Stress: Not on file  Relationships  . Social Musicianconnections    Talks on phone: Not on file    Gets together: Not on file    Attends religious service: Not on file    Active member of club or organization: Not on file    Attends meetings of clubs or organizations: Not on file    Relationship status: Not on file  . Intimate partner violence    Fear of current or ex partner: Not on file     Emotionally abused: Not on file    Physically abused: Not on file    Forced sexual activity: Not on file  Other Topics Concern  . Not on file  Social History Narrative  . Not on file    Outpatient Encounter Medications as of 02/23/2019  Medication Sig  . amoxicillin (AMOXIL) 500 MG capsule Take 1 capsule (500 mg total) by mouth 3 (three) times daily.  Marland Kitchen. amoxicillin-clavulanate (AUGMENTIN) 875-125 MG tablet Take 1 tablet by mouth 2 (two) times daily for 10 days.  . brompheniramine-pseudoephedrine-DM 30-2-10 MG/5ML syrup Take 5 mLs by mouth 4 (four) times daily as needed.  . fluconazole (DIFLUCAN) 150 MG tablet 1 po q week x 4 weeks  . fluticasone (FLONASE) 50 MCG/ACT nasal spray Place 2 sprays into both nostrils daily.  . norgestimate-ethinyl estradiol (SPRINTEC 28) 0.25-35 MG-MCG tablet Take 1 tablet by mouth daily. (Patient not taking: Reported on 06/02/2018)   No facility-administered encounter medications on file as of 02/23/2019.     Allergies  Allergen Reactions  . Aspirin Nausea And Vomiting  . Sulfa Antibiotics Hives    Review of Systems  Constitutional: Positive for activity change, chills and fever. Negative for appetite change, diaphoresis, fatigue and unexpected weight change.  HENT: Positive for congestion, ear pain, postnasal drip, rhinorrhea, sinus pressure, sinus pain, sneezing and sore throat. Negative for dental problem, drooling, ear discharge, facial swelling, hearing loss, mouth sores, nosebleeds, tinnitus, trouble swallowing and voice change.   Eyes: Negative.  Negative for photophobia and visual disturbance.  Respiratory: Positive for cough. Negative for chest tightness, shortness of breath and wheezing.   Cardiovascular: Negative for chest pain, palpitations and leg swelling.  Gastrointestinal: Negative for abdominal pain, blood in stool, constipation, diarrhea, nausea and vomiting.  Endocrine: Negative.   Genitourinary: Negative for decreased urine volume,  difficulty urinating, dysuria, frequency and urgency.  Musculoskeletal: Negative for arthralgias, joint pain, myalgias, neck pain and neck stiffness.  Skin: Negative.  Negative for color change and rash.  Allergic/Immunologic: Negative.   Neurological: Positive for headaches. Negative for dizziness and weakness.  Hematological: Negative.   Psychiatric/Behavioral: Negative for confusion, hallucinations, sleep disturbance and suicidal ideas.  All other systems reviewed and are negative.        Observations/Objective: No vital signs or physical exam, this was a telephone or virtual health encounter.  Pt alert and oriented, answers all questions appropriately, and able to speak in full sentences.    Assessment and Plan: Kristin Morgan was seen today for otalgia.  Diagnoses and all orders for this visit:  Non-recurrent acute suppurative otitis media of right ear without spontaneous rupture of tympanic membrane URI with cough and congestion Reported symptoms consistent with acute bacterial otitis media and URI with cough and congestion. Pt has failed symptomatic care at home for several days. Will initiate below. Symptomatic care discussed in detail. Report any new, worsening, or persistent symptoms. Follow up as needed.  -  amoxicillin-clavulanate (AUGMENTIN) 875-125 MG tablet; Take 1 tablet by mouth 2 (two) times daily for 10 days. -     fluticasone (FLONASE) 50 MCG/ACT nasal spray; Place 2 sprays into both nostrils daily. -     brompheniramine-pseudoephedrine-DM 30-2-10 MG/5ML syrup; Take 5 mLs by mouth 4 (four) times daily as needed.  History of vaginitis -     fluconazole (DIFLUCAN) 150 MG tablet; 1 po q week x 4 weeks     Follow Up Instructions: Return if symptoms worsen or fail to improve.    I discussed the assessment and treatment plan with the patient. The patient was provided an opportunity to ask questions and all were answered. The patient agreed with the plan and  demonstrated an understanding of the instructions.   The patient was advised to call back or seek an in-person evaluation if the symptoms worsen or if the condition fails to improve as anticipated.  The above assessment and management plan was discussed with the patient. The patient verbalized understanding of and has agreed to the management plan. Patient is aware to call the clinic if they develop any new symptoms or if symptoms persist or worsen. Patient is aware when to return to the clinic for a follow-up visit. Patient educated on when it is appropriate to go to the emergency department.    I provided 15 minutes of non-face-to-face time during this encounter. The call started at 1410. The call ended at 1425. The other time was used for coordination of care.    Monia Pouch, FNP-C [facility] [address] [place], [place] [address] [phone] 02/23/2019

## 2019-02-26 ENCOUNTER — Other Ambulatory Visit: Payer: Self-pay

## 2019-02-26 ENCOUNTER — Ambulatory Visit (INDEPENDENT_AMBULATORY_CARE_PROVIDER_SITE_OTHER): Payer: Medicaid Other | Admitting: Family

## 2019-02-26 DIAGNOSIS — R05 Cough: Secondary | ICD-10-CM | POA: Diagnosis not present

## 2019-02-26 DIAGNOSIS — J189 Pneumonia, unspecified organism: Secondary | ICD-10-CM | POA: Diagnosis not present

## 2019-02-26 NOTE — Progress Notes (Signed)
Pt went to ED and was diagnosed with pneumonia. Started on Zpak.

## 2019-04-10 NOTE — L&D Delivery Note (Signed)
OB/GYN Faculty Practice Delivery Note  Kristin Morgan is a 24 y.o. G3P3003 s/p SVD at [redacted]w[redacted]d. She was admitted for IOL for GHTN.   ROM: 9h 45m with clear fluid GBS Status:  Negative/-- (10/27 1530) Maximum Maternal Temperature: n/a  Labor Progress: . Initial SVE: 1.5/50. She received cytotec, FB, pitocin. She then progressed to complete.   Delivery Date/Time: 11/11 at 0950 Delivery: Called to room and patient was complete and pushing. Head delivered LOP. Nuchal cord around neck and body, reduced upon delivery. Shoulder and body delivered in usual fashion. Infant with spontaneous cry, placed on mother's abdomen, dried and stimulated. Cord clamped x 2 after 1-minute delay, and cut by FOB. Cord blood drawn. Placenta delivered spontaneously with gentle cord traction. Fundus firm with massage and Pitocin. Labia, perineum, vagina, and cervix inspected inspected with no lacerations. Postplacental IUD placed. See separate procedure note.   Baby Weight: pending  Placenta: Sent to L&D Complications: None Lacerations: none EBL: 50 mL Analgesia: Epidural   Infant:  APGAR (1 MIN): 9   APGAR (5 MINS): 9   APGAR (10 MINS):     Casper Harrison, MD [facility] Fellow, [facility] for [facility] 02/18/2020, 10:57 AM

## 2019-07-08 ENCOUNTER — Other Ambulatory Visit (HOSPITAL_COMMUNITY)
Admission: RE | Admit: 2019-07-08 | Discharge: 2019-07-08 | Disposition: A | Discharge status: Home / Self Care | Payer: Medicaid Other | Source: Ambulatory Visit | Attending: Adult Health | Admitting: Adult Health

## 2019-07-08 ENCOUNTER — Other Ambulatory Visit: Payer: Self-pay

## 2019-07-08 ENCOUNTER — Encounter: Payer: Self-pay | Admitting: Adult Health

## 2019-07-08 ENCOUNTER — Ambulatory Visit: Payer: Medicaid Other | Admitting: Adult Health

## 2019-07-08 VITALS — BP 127/70 | HR 94 | Ht 63.25 in | Wt 235.0 lb

## 2019-07-08 DIAGNOSIS — Z0000 Encounter for general adult medical examination without abnormal findings: Secondary | ICD-10-CM

## 2019-07-08 DIAGNOSIS — O3680X0 Pregnancy with inconclusive fetal viability, not applicable or unspecified: Secondary | ICD-10-CM

## 2019-07-08 DIAGNOSIS — Z01419 Encounter for gynecological examination (general) (routine) without abnormal findings: Secondary | ICD-10-CM | POA: Diagnosis not present

## 2019-07-08 DIAGNOSIS — Z3A01 Less than 8 weeks gestation of pregnancy: Secondary | ICD-10-CM

## 2019-07-08 DIAGNOSIS — Z3201 Encounter for pregnancy test, result positive: Secondary | ICD-10-CM | POA: Diagnosis not present

## 2019-07-08 LAB — POCT URINE PREGNANCY: Preg Test, Ur: POSITIVE — AB

## 2019-07-08 MED ORDER — PROMETHAZINE HCL 25 MG PO TABS: 25.0000 mg | ORAL_TABLET | Freq: Four times a day (QID) | ORAL | 1 refills | Status: DC | PRN

## 2019-07-08 NOTE — Addendum Note (Signed)
Addended by: Cyril Mourning A on: 07/08/2019 10:56 AM   Modules accepted: Orders

## 2019-07-08 NOTE — Progress Notes (Addendum)
Patient ID: Kristin Morgan, female   DOB: 09-May-1995, 24 y.o.   MRN: [id number] History of Present Illness: Kristin Morgan is a 24 year old white female,single,in for a well woman gyn exam and pap, and has missed a period and had +HPT, has had cramping and nausea. PCP is Jannifer Rodney NP.   Current Medications, Allergies, Past Medical History, Past Surgical History, Family History and Social History were reviewed in [employer] record.     Review of Systems: Patient denies any headaches, hearing loss, fatigue, blurred vision, shortness of breath, chest pain, abdominal pain, problems with bowel movements, urination, or intercourse. No joint pain or mood swings. Has missed a period and had +HPT, has nausea and cramping, has decreased libido.   Physical Exam:BP 127/70 (BP Location: Left Arm, Patient Position: Sitting, Cuff Size: Large)   Pulse 94   Ht 5' 3.25" (1.607 m)   Wt 235 lb (106.6 kg)   LMP 05/16/2019   Breastfeeding No   BMI 41.30 kg/m   UPT is +,about 7+4 weeks by LMP with EDD 02/20/20. General:  Well developed, well nourished, no acute distress Skin:  Warm and dry Neck:  Midline trachea, normal thyroid, good ROM, no lymphadenopathy Lungs; Clear to auscultation bilaterally Breast:  No dominant palpable mass, retraction, or nipple discharge Cardiovascular: Regular rate and rhythm Abdomen:  Soft, non tender, no hepatosplenomegaly Pelvic:  External genitalia is normal in appearance, no lesions.  The vagina is normal in appearance. Urethra has no lesions or masses. The cervix is bulbous.Pap with GC/[facility]  HPV 16/18 genotyping performed.  Uterus is felt to be normal size, shape, and contour.  No adnexal masses or tenderness noted.Bladder is non tender, no masses felt. Extremities/musculoskeletal:  No swelling or varicosities noted, no clubbing or cyanosis Psych:  No mood changes, alert and cooperative,seems happy Alcohol audit is 0 Fall risk is low PHQ 9 score is  2. Examination chaperoned by Malachy Mood LPN  Impression and Plan: 1. Pregnancy examination or test, positive result  2. Routine medical exam  3. Encounter for gynecological examination with Papanicolaou smear of cervix Pap sent Physical in 1 year Pap in 3 if normal  4. Less than [redacted] weeks gestation of pregnancy Continue PNV Will Rx phenergan for nausea Meds ordered this encounter  Medications  . promethazine (PHENERGAN) 25 MG tablet    Sig: Take 1 tablet (25 mg total) by mouth every 6 (six) hours as needed for nausea or vomiting.    Dispense:  30 tablet    Refill:  1    Order Specific Question:   Supervising Provider    Answer:   Despina Hidden, LUTHER H [[id number]]     5. Encounter to determine fetal viability of pregnancy, single or unspecified fetus Return in 1 week for dating [name] Review handout on First trimester and by [facility]

## 2019-07-08 NOTE — Patient Instructions (Signed)
First Trimester of Pregnancy The first trimester of pregnancy is from week 1 until the end of week 13 (months 1 through 3). A week after a sperm fertilizes an egg, the egg will implant on the wall of the uterus. This embryo will begin to develop into a baby. Genes from you and your partner will form the baby. The female genes will determine whether the baby will be a boy or a girl. At 6-8 weeks, the eyes and face will be formed, and the heartbeat can be seen on ultrasound. At the end of 12 weeks, all the baby's organs will be formed. Now that you are pregnant, you will want to do everything you can to have a healthy baby. Two of the most important things are to get good prenatal care and to follow your health care provider's instructions. Prenatal care is all the medical care you receive before the baby's birth. This care will help prevent, find, and treat any problems during the pregnancy and childbirth. Body changes during your first trimester Your body goes through many changes during pregnancy. The changes vary from woman to woman.  You may gain or lose a couple of pounds at first.  You may feel sick to your stomach (nauseous) and you may throw up (vomit). If the vomiting is uncontrollable, call your health care provider.  You may tire easily.  You may develop headaches that can be relieved by medicines. All medicines should be approved by your health care provider.  You may urinate more often. Painful urination may mean you have a bladder infection.  You may develop heartburn as a result of your pregnancy.  You may develop constipation because certain hormones are causing the muscles that push stool through your intestines to slow down.  You may develop hemorrhoids or swollen veins (varicose veins).  Your breasts may begin to grow larger and become tender. Your nipples may stick out more, and the tissue that surrounds them (areola) may become darker.  Your gums may bleed and may be  sensitive to brushing and flossing.  Dark spots or blotches (chloasma, mask of pregnancy) may develop on your face. This will likely fade after the baby is born.  Your menstrual periods will stop.  You may have a loss of appetite.  You may develop cravings for certain kinds of food.  You may have changes in your emotions from day to day, such as being excited to be pregnant or being concerned that something may go wrong with the pregnancy and baby.  You may have more vivid and strange dreams.  You may have changes in your hair. These can include thickening of your hair, rapid growth, and changes in texture. Some women also have hair loss during or after pregnancy, or hair that feels dry or thin. Your hair will most likely return to normal after your baby is born. What to expect at prenatal visits During a routine prenatal visit:  You will be weighed to make sure you and the baby are growing normally.  Your blood pressure will be taken.  Your abdomen will be measured to track your baby's growth.  The fetal heartbeat will be listened to between weeks 10 and 14 of your pregnancy.  Test results from any previous visits will be discussed. Your health care provider may ask you:  How you are feeling.  If you are feeling the baby move.  If you have had any abnormal symptoms, such as leaking fluid, bleeding, severe headaches, or abdominal   cramping.  If you are using any tobacco products, including cigarettes, chewing tobacco, and electronic cigarettes.  If you have any questions. Other tests that may be performed during your first trimester include:  Blood tests to find your blood type and to check for the presence of any previous infections. The tests will also be used to check for low iron levels (anemia) and protein on red blood cells (Rh antibodies). Depending on your risk factors, or if you previously had diabetes during pregnancy, you may have tests to check for high blood sugar  that affects pregnant women (gestational diabetes).  Urine tests to check for infections, diabetes, or protein in the urine.  An ultrasound to confirm the proper growth and development of the baby.  Fetal screens for spinal cord problems (spina bifida) and Down syndrome.  HIV (human immunodeficiency virus) testing. Routine prenatal testing includes screening for HIV, unless you choose not to have this test.  You may need other tests to make sure you and the baby are doing well. Follow these instructions at home: Medicines  Follow your health care provider's instructions regarding medicine use. Specific medicines may be either safe or unsafe to take during pregnancy.  Take a prenatal vitamin that contains at least 600 micrograms (mcg) of folic acid.  If you develop constipation, try taking a stool softener if your health care provider approves. Eating and drinking   Eat a balanced diet that includes fresh fruits and vegetables, whole grains, good sources of protein such as meat, eggs, or tofu, and low-fat dairy. Your health care provider will help you determine the amount of weight gain that is right for you.  Avoid raw meat and uncooked cheese. These carry germs that can cause birth defects in the baby.  Eating four or five small meals rather than three large meals a day may help relieve nausea and vomiting. If you start to feel nauseous, eating a few soda crackers can be helpful. Drinking liquids between meals, instead of during meals, also seems to help ease nausea and vomiting.  Limit foods that are high in fat and processed sugars, such as fried and sweet foods.  To prevent constipation: ? Eat foods that are high in fiber, such as fresh fruits and vegetables, whole grains, and beans. ? Drink enough fluid to keep your urine clear or pale yellow. Activity  Exercise only as directed by your health care provider. Most women can continue their usual exercise routine during  pregnancy. Try to exercise for 30 minutes at least 5 days a week. Exercising will help you: ? Control your weight. ? Stay in shape. ? Be prepared for labor and delivery.  Experiencing pain or cramping in the lower abdomen or lower back is a good sign that you should stop exercising. Check with your health care provider before continuing with normal exercises.  Try to avoid standing for long periods of time. Move your legs often if you must stand in one place for a long time.  Avoid heavy lifting.  Wear low-heeled shoes and practice good posture.  You may continue to have sex unless your health care provider tells you not to. Relieving pain and discomfort  Wear a good support bra to relieve breast tenderness.  Take warm sitz baths to soothe any pain or discomfort caused by hemorrhoids. Use hemorrhoid cream if your health care provider approves.  Rest with your legs elevated if you have leg cramps or low back pain.  If you develop varicose veins in   your legs, wear support hose. Elevate your feet for 15 minutes, 3-4 times a day. Limit salt in your diet. Prenatal care  Schedule your prenatal visits by the twelfth week of pregnancy. They are usually scheduled monthly at first, then more often in the last 2 months before delivery.  Write down your questions. Take them to your prenatal visits.  Keep all your prenatal visits as told by your health care provider. This is important. Safety  Wear your seat belt at all times when driving.  Make a list of emergency phone numbers, including numbers for family, friends, the hospital, and police and fire departments. General instructions  Ask your health care provider for a referral to a local prenatal education class. Begin classes no later than the beginning of month 6 of your pregnancy.  Ask for help if you have counseling or nutritional needs during pregnancy. Your health care provider can offer advice or refer you to specialists for help  with various needs.  Do not use hot tubs, steam rooms, or saunas.  Do not douche or use tampons or scented sanitary pads.  Do not cross your legs for long periods of time.  Avoid cat litter boxes and soil used by cats. These carry germs that can cause birth defects in the baby and possibly loss of the fetus by miscarriage or stillbirth.  Avoid all smoking, herbs, alcohol, and medicines not prescribed by your health care provider. Chemicals in these products affect the formation and growth of the baby.  Do not use any products that contain nicotine or tobacco, such as cigarettes and e-cigarettes. If you need help quitting, ask your health care provider. You may receive counseling support and other resources to help you quit.  Schedule a dentist appointment. At home, brush your teeth with a soft toothbrush and be gentle when you floss. Contact a health care provider if:  You have dizziness.  You have mild pelvic cramps, pelvic pressure, or nagging pain in the abdominal area.  You have persistent nausea, vomiting, or diarrhea.  You have a bad smelling vaginal discharge.  You have pain when you urinate.  You notice increased swelling in your face, hands, legs, or ankles.  You are exposed to fifth disease or chickenpox.  You are exposed to German measles (rubella) and have never had it. Get help right away if:  You have a fever.  You are leaking fluid from your vagina.  You have spotting or bleeding from your vagina.  You have severe abdominal cramping or pain.  You have rapid weight gain or loss.  You vomit blood or material that looks like coffee grounds.  You develop a severe headache.  You have shortness of breath.  You have any kind of trauma, such as from a fall or a car accident. Summary  The first trimester of pregnancy is from week 1 until the end of week 13 (months 1 through 3).  Your body goes through many changes during pregnancy. The changes vary from  woman to woman.  You will have routine prenatal visits. During those visits, your health care provider will examine you, discuss any test results you may have, and talk with you about how you are feeling. This information is not intended to replace advice given to you by your health care provider. Make sure you discuss any questions you have with your health care provider. Document Revised: 03/08/2017 Document Reviewed: 03/07/2016 Elsevier Patient Education  2020 [employer]  

## 2019-07-10 LAB — CYTOLOGY - PAP
Adequacy: ABSENT
Chlamydia: NEGATIVE
Comment: NEGATIVE
Comment: NEGATIVE
Comment: NORMAL
Diagnosis: NEGATIVE
High risk HPV: NEGATIVE
Neisseria Gonorrhea: NEGATIVE

## 2019-07-15 ENCOUNTER — Other Ambulatory Visit: Payer: Self-pay

## 2019-07-15 ENCOUNTER — Ambulatory Visit (INDEPENDENT_AMBULATORY_CARE_PROVIDER_SITE_OTHER): Payer: Medicaid Other

## 2019-07-15 DIAGNOSIS — O3680X0 Pregnancy with inconclusive fetal viability, not applicable or unspecified: Secondary | ICD-10-CM

## 2019-07-15 DIAGNOSIS — Z3A01 Less than 8 weeks gestation of pregnancy: Secondary | ICD-10-CM | POA: Diagnosis not present

## 2019-07-15 NOTE — Progress Notes (Signed)
[name] 7+4 wks,single IUP with YS,positive fht 157 bpm,normal ovaries,crl 13.28 mm

## 2019-08-04 ENCOUNTER — Ambulatory Visit (INDEPENDENT_AMBULATORY_CARE_PROVIDER_SITE_OTHER): Payer: Medicaid Other | Admitting: Nurse Practitioner

## 2019-08-04 ENCOUNTER — Encounter: Payer: Self-pay | Admitting: Nurse Practitioner

## 2019-08-04 DIAGNOSIS — J0100 Acute maxillary sinusitis, unspecified: Secondary | ICD-10-CM | POA: Diagnosis not present

## 2019-08-04 DIAGNOSIS — H9201 Otalgia, right ear: Secondary | ICD-10-CM | POA: Diagnosis not present

## 2019-08-04 MED ORDER — CEPHALEXIN 500 MG PO CAPS: 500.0000 mg | ORAL_CAPSULE | Freq: Three times a day (TID) | ORAL | 0 refills | Status: DC

## 2019-08-04 NOTE — Progress Notes (Signed)
   Virtual Visit via telephone Note Due to COVID-19 pandemic this visit was conducted virtually. This visit type was conducted due to national recommendations for restrictions regarding the COVID-19 Pandemic (e.g. social distancing, sheltering in place) in an effort to limit this patient's exposure and mitigate transmission in our community. All issues noted in this document were discussed and addressed.  A physical exam was not performed with this format.  I connected with Kristin Morgan on 08/04/19 at 11:20 by telephone and verified that I am speaking with the correct person using two identifiers. Kristin Morgan is currently located at home and no one is currently with  her during visit. The provider, Mary-Margaret Daphine Deutscher, FNP is located in their office at time of visit.  I discussed the limitations, risks, security and privacy concerns of performing an evaluation and management service by telephone and the availability of in person appointments. I also discussed with the patient that there may be a patient responsible charge related to this service. The patient expressed understanding and agreed to proceed.   History and Present Illness:   Chief Complaint: Sinusitis   HPI Patient has green mucus coming out of her nose. Her ears are hurt and draining. Slight cough. She is [redacted] weeks pregnant  Review of Systems  Constitutional: Negative.  Negative for diaphoresis and weight loss.  Eyes: Negative for blurred vision, double vision and pain.  Respiratory: Negative for shortness of breath.   Cardiovascular: Negative for chest pain, palpitations, orthopnea and leg swelling.  Gastrointestinal: Negative for abdominal pain.  Skin: Negative for rash.  Neurological: Negative for dizziness, sensory change, loss of consciousness, weakness and headaches.  Endo/Heme/Allergies: Negative for polydipsia. Does not bruise/bleed easily.  Psychiatric/Behavioral: Negative for memory loss. The patient does not have  insomnia.   All other systems reviewed and are negative.    Observations/Objective: Alert and oriented- answers all questions appropriately No distress Voice hoarse  Assessment and Plan: Kristin Morgan in today with chief complaint of Sinusitis   1. Acute maxillary sinusitis, recurrence not specified  2. Right ear pain Force fluids Sudafed OTC Rest Do not take any other meds without checking with OB - cephALEXin (KEFLEX) 500 MG capsule; Take 1 capsule (500 mg total) by mouth 3 (three) times daily.  Dispense: 21 capsule; Refill: 0     Follow Up Instructions: prn    I discussed the assessment and treatment plan with the patient. The patient was provided an opportunity to ask questions and all were answered. The patient agreed with the plan and demonstrated an understanding of the instructions.   The patient was advised to call back or seek an in-person evaluation if the symptoms worsen or if the condition fails to improve as anticipated.  The above assessment and management plan was discussed with the patient. The patient verbalized understanding of and has agreed to the management plan. Patient is aware to call the clinic if symptoms persist or worsen. Patient is aware when to return to the clinic for a follow-up visit. Patient educated on when it is appropriate to go to the emergency department.   Time call ended:  11:30  I provided 10 minutes of non-face-to-face time during this encounter.    Mary-Margaret Daphine Deutscher, FNP

## 2019-08-20 ENCOUNTER — Other Ambulatory Visit: Payer: Self-pay | Admitting: Obstetrics and Gynecology

## 2019-08-20 DIAGNOSIS — Z3682 Encounter for antenatal screening for nuchal translucency: Secondary | ICD-10-CM

## 2019-08-21 ENCOUNTER — Ambulatory Visit: Payer: Medicaid Other | Admitting: *Deleted

## 2019-08-21 ENCOUNTER — Ambulatory Visit (INDEPENDENT_AMBULATORY_CARE_PROVIDER_SITE_OTHER): Payer: Medicaid Other | Admitting: Women's Health

## 2019-08-21 ENCOUNTER — Ambulatory Visit (INDEPENDENT_AMBULATORY_CARE_PROVIDER_SITE_OTHER): Payer: Medicaid Other

## 2019-08-21 ENCOUNTER — Encounter: Payer: Self-pay | Admitting: Women's Health

## 2019-08-21 VITALS — BP 119/65 | HR 79 | Wt 231.0 lb

## 2019-08-21 DIAGNOSIS — Z3481 Encounter for supervision of other normal pregnancy, first trimester: Secondary | ICD-10-CM | POA: Diagnosis not present

## 2019-08-21 DIAGNOSIS — Z3480 Encounter for supervision of other normal pregnancy, unspecified trimester: Secondary | ICD-10-CM | POA: Insufficient documentation

## 2019-08-21 DIAGNOSIS — Z3A12 12 weeks gestation of pregnancy: Secondary | ICD-10-CM

## 2019-08-21 DIAGNOSIS — Z6841 Body Mass Index (BMI) 40.0 and over, adult: Secondary | ICD-10-CM

## 2019-08-21 DIAGNOSIS — Z3682 Encounter for antenatal screening for nuchal translucency: Secondary | ICD-10-CM | POA: Diagnosis not present

## 2019-08-21 LAB — POCT URINALYSIS DIPSTICK OB
Blood, UA: NEGATIVE
Glucose, UA: NEGATIVE
Ketones, UA: NEGATIVE
Leukocytes, UA: NEGATIVE
Nitrite, UA: NEGATIVE
POC,PROTEIN,UA: NEGATIVE

## 2019-08-21 MED ORDER — BLOOD PRESSURE MONITOR MISC: 0 refills | Status: DC

## 2019-08-21 NOTE — Patient Instructions (Signed)
Kristin Morgan, I greatly value your feedback.  If you receive a survey following your visit with [name] today, we appreciate you taking the time to fill it out.  Thanks, Joellyn Haff, CNM, WHNP-BC   [facility] ([address] [place], [place] [address]) Entrance C, located off of [address] Free 24/7 valet parking   Nausea & Vomiting  Have saltine crackers or pretzels by your bed and eat a few bites before you raise your head out of bed in the morning  Eat small frequent meals throughout the day instead of large meals  Drink plenty of fluids throughout the day to stay hydrated, just don't drink a lot of fluids with your meals.  This can make your stomach fill up faster making you feel sick  Do not brush your teeth right after you eat  Products with real ginger are good for nausea, like ginger ale and ginger hard candy Make sure it says made with real ginger!  Sucking on sour candy like lemon heads is also good for nausea  If your prenatal vitamins make you nauseated, take them at night so you will sleep through the nausea  Sea Bands  If you feel like you need medicine for the nausea & vomiting please let us know  If you are unable to keep any fluids or food down please let us know   Constipation  Drink plenty of fluid, preferably water, throughout the day  Eat foods high in fiber such as fruits, vegetables, and grains  Exercise, such as walking, is a good way to keep your bowels regular  Drink warm fluids, especially warm prune juice, or decaf coffee  Eat a 1/2 cup of real oatmeal (not instant), 1/2 cup applesauce, and 1/2-1 cup warm prune juice every day  If needed, you may take Colace (docusate sodium) stool softener once or twice a day to help keep the stool soft.   If you still are having problems with constipation, you may take Miralax once daily as needed to help keep your bowels regular.   Home Blood Pressure Monitoring for Patients   Your  provider has recommended that you check your blood pressure (BP) at least once a week at home. If you do not have a blood pressure cuff at home, one will be provided for you. Contact your provider if you have not received your monitor within 1 week.   Helpful Tips for Accurate Home Blood Pressure Checks  . Don't smoke, exercise, or drink caffeine 30 minutes before checking your BP . Use the restroom before checking your BP (a full bladder can raise your pressure) . Relax in a comfortable upright chair . Feet on the ground . Left arm resting comfortably on a flat surface at the level of your heart . Legs uncrossed . Back supported . Sit quietly and don't talk . Place the cuff on your bare arm . Adjust snuggly, so that only two fingertips can fit between your skin and the top of the cuff . Check 2 readings separated by at least one minute . Keep a log of your BP readings . For a visual, please reference this diagram: http://ccnc.care/bpdiagram  Provider Name: [facility]     Phone: [phone]  Zone 1: ALL CLEAR  Continue to monitor your symptoms:  . BP reading is less than 140 (top number) or less than 90 (bottom number)  . No right upper stomach pain . No headaches or seeing  spots . No feeling nauseated or throwing up . No swelling in face and hands  Zone 2: CAUTION Call your doctor's office for any of the following:  . BP reading is greater than 140 (top number) or greater than 90 (bottom number)  . Stomach pain under your ribs in the middle or right side . Headaches or seeing spots . Feeling nauseated or throwing up . Swelling in face and hands  Zone 3: EMERGENCY  Seek immediate medical care if you have any of the following:  . BP reading is greater than160 (top number) or greater than 110 (bottom number) . Severe headaches not improving with Tylenol . Serious difficulty catching your breath . Any worsening symptoms from Zone 2    First Trimester of Pregnancy The  first trimester of pregnancy is from week 1 until the end of week 12 (months 1 through 3). A week after a sperm fertilizes an egg, the egg will implant on the wall of the uterus. This embryo will begin to develop into a baby. Genes from you and your partner are forming the baby. The female genes determine whether the baby is a boy or a girl. At 6-8 weeks, the eyes and face are formed, and the heartbeat can be seen on ultrasound. At the end of 12 weeks, all the baby's organs are formed.  Now that you are pregnant, you will want to do everything you can to have a healthy baby. Two of the most important things are to get good prenatal care and to follow your health care provider's instructions. Prenatal care is all the medical care you receive before the baby's birth. This care will help prevent, find, and treat any problems during the pregnancy and childbirth. BODY CHANGES Your body goes through many changes during pregnancy. The changes vary from woman to woman.   You may gain or lose a couple of pounds at first.  You may feel sick to your stomach (nauseous) and throw up (vomit). If the vomiting is uncontrollable, call your health care provider.  You may tire easily.  You may develop headaches that can be relieved by medicines approved by your health care provider.  You may urinate more often. Painful urination may mean you have a bladder infection.  You may develop heartburn as a result of your pregnancy.  You may develop constipation because certain hormones are causing the muscles that push waste through your intestines to slow down.  You may develop hemorrhoids or swollen, bulging veins (varicose veins).  Your breasts may begin to grow larger and become tender. Your nipples may stick out more, and the tissue that surrounds them (areola) may become darker.  Your gums may bleed and may be sensitive to brushing and flossing.  Dark spots or blotches (chloasma, mask of pregnancy) may develop on  your face. This will likely fade after the baby is born.  Your menstrual periods will stop.  You may have a loss of appetite.  You may develop cravings for certain kinds of food.  You may have changes in your emotions from day to day, such as being excited to be pregnant or being concerned that something may go wrong with the pregnancy and baby.  You may have more vivid and strange dreams.  You may have changes in your hair. These can include thickening of your hair, rapid growth, and changes in texture. Some women also have hair loss during or after pregnancy, or hair that feels dry or thin. Your hair  will most likely return to normal after your baby is born. WHAT TO EXPECT AT YOUR PRENATAL VISITS During a routine prenatal visit:  You will be weighed to make sure you and the baby are growing normally.  Your blood pressure will be taken.  Your abdomen will be measured to track your baby's growth.  The fetal heartbeat will be listened to starting around week 10 or 12 of your pregnancy.  Test results from any previous visits will be discussed. Your health care provider may ask you:  How you are feeling.  If you are feeling the baby move.  If you have had any abnormal symptoms, such as leaking fluid, bleeding, severe headaches, or abdominal cramping.  If you have any questions. Other tests that may be performed during your first trimester include:  Blood tests to find your blood type and to check for the presence of any previous infections. They will also be used to check for low iron levels (anemia) and Rh antibodies. Later in the pregnancy, blood tests for diabetes will be done along with other tests if problems develop.  Urine tests to check for infections, diabetes, or protein in the urine.  An ultrasound to confirm the proper growth and development of the baby.  An amniocentesis to check for possible genetic problems.  Fetal screens for spina bifida and Down  syndrome.  You may need other tests to make sure you and the baby are doing well. HOME CARE INSTRUCTIONS  Medicines  Follow your health care provider's instructions regarding medicine use. Specific medicines may be either safe or unsafe to take during pregnancy.  Take your prenatal vitamins as directed.  If you develop constipation, try taking a stool softener if your health care provider approves. Diet  Eat regular, well-balanced meals. Choose a variety of foods, such as meat or vegetable-based protein, fish, milk and low-fat dairy products, vegetables, fruits, and whole grain breads and cereals. Your health care provider will help you determine the amount of weight gain that is right for you.  Avoid raw meat and uncooked cheese. These carry germs that can cause birth defects in the baby.  Eating four or five small meals rather than three large meals a day may help relieve nausea and vomiting. If you start to feel nauseous, eating a few soda crackers can be helpful. Drinking liquids between meals instead of during meals also seems to help nausea and vomiting.  If you develop constipation, eat more high-fiber foods, such as fresh vegetables or fruit and whole grains. Drink enough fluids to keep your urine clear or pale yellow. Activity and Exercise  Exercise only as directed by your health care provider. Exercising will help you:  Control your weight.  Stay in shape.  Be prepared for labor and delivery.  Experiencing pain or cramping in the lower abdomen or low back is a good sign that you should stop exercising. Check with your health care provider before continuing normal exercises.  Try to avoid standing for long periods of time. Move your legs often if you must stand in one place for a long time.  Avoid heavy lifting.  Wear low-heeled shoes, and practice good posture.  You may continue to have sex unless your health care provider directs you otherwise. Relief of Pain or  Discomfort  Wear a good support bra for breast tenderness.    Take warm sitz baths to soothe any pain or discomfort caused by hemorrhoids. Use hemorrhoid cream if your health care provider approves.  Rest with your legs elevated if you have leg cramps or low back pain.  If you develop varicose veins in your legs, wear support hose. Elevate your feet for 15 minutes, 3-4 times a day. Limit salt in your diet. Prenatal Care  Schedule your prenatal visits by the twelfth week of pregnancy. They are usually scheduled monthly at first, then more often in the last 2 months before delivery.  Write down your questions. Take them to your prenatal visits.  Keep all your prenatal visits as directed by your health care provider. Safety  Wear your seat belt at all times when driving.  Make a list of emergency phone numbers, including numbers for family, friends, the hospital, and police and fire departments. General Tips  Ask your health care provider for a referral to a local prenatal education class. Begin classes no later than at the beginning of month 6 of your pregnancy.  Ask for help if you have counseling or nutritional needs during pregnancy. Your health care provider can offer advice or refer you to specialists for help with various needs.  Do not use hot tubs, steam rooms, or saunas.  Do not douche or use tampons or scented sanitary pads.  Do not cross your legs for long periods of time.  Avoid cat litter boxes and soil used by cats. These carry germs that can cause birth defects in the baby and possibly loss of the fetus by miscarriage or stillbirth.  Avoid all smoking, herbs, alcohol, and medicines not prescribed by your health care provider. Chemicals in these affect the formation and growth of the baby.  Schedule a dentist appointment. At home, brush your teeth with a soft toothbrush and be gentle when you floss. SEEK MEDICAL CARE IF:   You have dizziness.  You have mild  pelvic cramps, pelvic pressure, or nagging pain in the abdominal area.  You have persistent nausea, vomiting, or diarrhea.  You have a bad smelling vaginal discharge.  You have pain with urination.  You notice increased swelling in your face, hands, legs, or ankles. SEEK IMMEDIATE MEDICAL CARE IF:   You have a fever.  You are leaking fluid from your vagina.  You have spotting or bleeding from your vagina.  You have severe abdominal cramping or pain.  You have rapid weight gain or loss.  You vomit blood or material that looks like coffee grounds.  You are exposed to [name] measles and have never had them.  You are exposed to fifth disease or chickenpox.  You develop a severe headache.  You have shortness of breath.  You have any kind of trauma, such as from a fall or a car accident. Document Released: 03/20/2001 Document Revised: 08/10/2013 Document Reviewed: 02/03/2013 [facility] Patient Information 2015 [place], [place]. This information is not intended to replace advice given to you by your health care provider. Make sure you discuss any questions you have with your health care provider.

## 2019-08-21 NOTE — Progress Notes (Signed)
INITIAL OBSTETRICAL VISIT Patient name: Kristin Morgan MRN [id number]  Date of birth: [birth year] Chief Complaint:   Initial Prenatal Visit (nt/it)  History of Present Illness:   Kristin Morgan is a 24 y.o. G50P2002 Caucasian female at [redacted]w[redacted]d by 7wk u/s, with an Estimated Date of Delivery: 02/27/20 being seen today for her initial obstetrical visit.   Her obstetrical history is significant for term uncomplicated SVB x 2 @ [facility].   Today she reports no complaints.  Depression screen [facility] 2/9 08/21/2019 08/04/2019 07/08/2019 06/02/2018 07/30/2016  Decreased Interest 0 0 0 0 0  Down, Depressed, Hopeless 0 0 0 0 0  PHQ - 2 Score 0 0 0 0 0  Altered sleeping 0 - 1 - -  Tired, decreased energy 0 - 1 - -  Change in appetite 0 - 0 - -  Feeling bad or failure about yourself  0 - 0 - -  Trouble concentrating 0 - 0 - -  Moving slowly or fidgety/restless 0 - 0 - -  Suicidal thoughts 0 - 0 - -  PHQ-9 Score 0 - 2 - -  Difficult doing work/chores Not difficult at all - Not difficult at all - -    Patient's last menstrual period was 05/16/2019. Last pap 07/08/19. Results were: normal Review of Systems:   Pertinent items are noted in HPI Denies cramping/contractions, leakage of fluid, vaginal bleeding, abnormal vaginal discharge w/ itching/odor/irritation, headaches, visual changes, shortness of breath, chest pain, abdominal pain, severe nausea/vomiting, or problems with urination or bowel movements unless otherwise stated above.  Pertinent History Reviewed:  Reviewed past medical,surgical, social, obstetrical and family history.  Reviewed problem list, medications and allergies. OB History  Gravida Para Term Preterm AB Living  3 2 2     2   SAB TAB Ectopic Multiple Live Births          2    # Outcome Date GA Lbr Len/2nd Weight Sex Delivery Anes PTL Lv  3 Current           2 Term 12/29/15 [redacted]w[redacted]d  7 lb 15 oz (3.6 kg) M Vag-Spont EPI N LIV  1 Term 09/29/13 [redacted]w[redacted]d  7 lb 6 oz (3.345 kg) M Vag-Vacuum EPI N LIV      Complications: Fetal Intolerance   Physical Assessment:   Vitals:   08/21/19 1054  BP: 119/65  Pulse: 79  Weight: 231 lb (104.8 kg)  Body mass index is 40.6 kg/m.       Physical Examination:  General appearance - well appearing, and in no distress  Mental status - alert, oriented to person, place, and time  Psych:  She has a normal mood and affect  Skin - warm and dry, normal color, no suspicious lesions noted  Chest - effort normal, all lung fields clear to auscultation bilaterally  Heart - normal rate and regular rhythm  Abdomen - soft, nontender  Extremities:  No swelling or varicosities noted  Thin prep pap is not done   TODAY'S NT 08/23/19 12+6 wks,measurements c/w dates,crl 67.95 mm,FHR 171 BPM,normal ovaries,NB present,NT 1.3 mm  Results for orders placed or performed in visit on 08/21/19 (from the past 24 hour(s))  POC Urinalysis Dipstick OB   Collection Time: 08/21/19 11:23 AM  Result Value Ref Range   Color, UA     Clarity, UA     Glucose, UA Negative Negative   Bilirubin, UA     Ketones, UA neg    Spec Grav, UA  Blood, UA neg    pH, UA     POC,PROTEIN,UA Negative Negative, Trace, Small (1+), Moderate (2+), Large (3+), 4+   Urobilinogen, UA     Nitrite, UA neg    Leukocytes, UA Negative Negative   Appearance     Odor      Assessment & Plan:  1) Low-Risk Pregnancy G3P2002 at [redacted]w[redacted]d with an Estimated Date of Delivery: 02/27/20   2) Initial OB visit  Meds:  Meds ordered this encounter  Medications  . Blood Pressure Monitor MISC    Sig: For regular home bp monitoring during pregnancy    Dispense:  1 each    Refill:  0    Z34.80 Needs large cuff    Initial labs obtained Continue prenatal vitamins Reviewed n/v relief measures and warning s/s to report Reviewed recommended weight gain based on pre-gravid BMI Encouraged well-balanced diet Genetic & carrier screening discussed: requests [place] and NT/IT, declines [place] 14  Ultrasound discussed; fetal  survey: requested [facility] completed> form faxed if has or is planning to apply for medicaid The nature of [place] for [employer] with multiple MDs and other Advanced Practice Providers was explained to patient; also emphasized that fellows, residents, and students are part of our team. Does not have home bp cuff. Rx faxed to [facility]. Check bp weekly, let us know if >140/90.   Follow-up: Return in about 3 weeks (around 09/11/2019) for [employer], 2nd IT, in person, CNM.   Orders Placed This Encounter  Procedures  . Urine Culture  . GC/Chlamydia Probe Amp  . Integrated 1  . Genetic Screening  . Hepatitis C antibody  . Obstetric Panel, Including HIV  . Pain Management Screening Profile (10S)  . Hgb Fractionation Cascade  . Hemoglobin A1c  . POC Urinalysis Dipstick OB    Roma Schanz CNM, [facility] 08/21/2019 11:37 AM

## 2019-08-21 NOTE — Progress Notes (Signed)
[name] 12+6 wks,measurements c/w dates,crl 67.95 mm,FHR 171 BPM,normal ovaries,NB present,NT 1.3 mm

## 2019-08-23 LAB — URINE CULTURE

## 2019-08-23 LAB — GC/CHLAMYDIA PROBE AMP
Chlamydia trachomatis, NAA: NEGATIVE
Neisseria Gonorrhoeae by PCR: NEGATIVE

## 2019-08-23 LAB — MED LIST OPTION NOT SELECTED

## 2019-08-24 ENCOUNTER — Other Ambulatory Visit: Payer: Self-pay | Admitting: Women's Health

## 2019-08-24 LAB — PMP SCREEN PROFILE (10S), URINE
Amphetamine Scrn, Ur: NEGATIVE ng/mL
BARBITURATE SCREEN URINE: NEGATIVE ng/mL
BENZODIAZEPINE SCREEN, URINE: NEGATIVE ng/mL
CANNABINOIDS UR QL SCN: NEGATIVE ng/mL
Cocaine (Metab) Scrn, Ur: NEGATIVE ng/mL
Creatinine(Crt), U: 105.9 mg/dL (ref 20.0–300.0)
Methadone Screen, Urine: NEGATIVE ng/mL
OXYCODONE+OXYMORPHONE UR QL SCN: NEGATIVE ng/mL
Opiate Scrn, Ur: NEGATIVE ng/mL
Ph of Urine: 6.6 (ref 4.5–8.9)
Phencyclidine Qn, Ur: NEGATIVE ng/mL
Propoxyphene Scrn, Ur: NEGATIVE ng/mL

## 2019-08-24 MED ORDER — FERROUS SULFATE 325 (65 FE) MG PO TABS: 325.0000 mg | ORAL_TABLET | Freq: Two times a day (BID) | ORAL | 3 refills | Status: DC

## 2019-08-25 LAB — HGB FRACTIONATION CASCADE
Hgb A2: 2.3 % (ref 1.8–3.2)
Hgb A: 97.7 % (ref 96.4–98.8)
Hgb F: 0 % (ref 0.0–2.0)
Hgb S: 0 %

## 2019-08-25 LAB — OBSTETRIC PANEL, INCLUDING HIV
Antibody Screen: NEGATIVE
Basophils Absolute: 0 10*3/uL (ref 0.0–0.2)
Basos: 0 %
EOS (ABSOLUTE): 0.1 10*3/uL (ref 0.0–0.4)
Eos: 1 %
HIV Screen 4th Generation wRfx: NONREACTIVE
Hematocrit: 34.8 % (ref 34.0–46.6)
Hemoglobin: 10.7 g/dL — ABNORMAL LOW (ref 11.1–15.9)
Hepatitis B Surface Ag: NEGATIVE
Immature Grans (Abs): 0 10*3/uL (ref 0.0–0.1)
Immature Granulocytes: 0 %
Lymphocytes Absolute: 1.9 10*3/uL (ref 0.7–3.1)
Lymphs: 19 %
MCH: 21.7 pg — ABNORMAL LOW (ref 26.6–33.0)
MCHC: 30.7 g/dL — ABNORMAL LOW (ref 31.5–35.7)
MCV: 70 fL — ABNORMAL LOW (ref 79–97)
Monocytes Absolute: 0.6 10*3/uL (ref 0.1–0.9)
Monocytes: 6 %
Neutrophils Absolute: 7.4 10*3/uL — ABNORMAL HIGH (ref 1.4–7.0)
Neutrophils: 74 %
Platelets: 331 10*3/uL (ref 150–450)
RBC: 4.94 x10E6/uL (ref 3.77–5.28)
RDW: 16.8 % — ABNORMAL HIGH (ref 11.7–15.4)
RPR Ser Ql: NONREACTIVE
Rh Factor: POSITIVE
Rubella Antibodies, IGG: 5.92 index (ref >0.99)
WBC: 10 10*3/uL (ref 3.4–10.8)

## 2019-08-25 LAB — INTEGRATED 1
Crown Rump Length: 68 mm
Gest. Age on Collection Date: 12.9 weeks
Maternal Age at EDD: 26.4 yr
Nuchal Translucency (NT): 1.3 mm
Number of Fetuses: 1
PAPP-A Value: 337.9 ng/mL
Weight: 231 [lb_av]

## 2019-08-25 LAB — HEMOGLOBIN A1C
Est. average glucose Bld gHb Est-mCnc: 111 mg/dL
Hgb A1c MFr Bld: 5.5 % (ref 4.8–5.6)

## 2019-08-25 LAB — HEPATITIS C ANTIBODY: Hep C Virus Ab: <0.1 s/co ratio (ref 0.0–0.9)

## 2019-09-10 ENCOUNTER — Telehealth (INDEPENDENT_AMBULATORY_CARE_PROVIDER_SITE_OTHER): Payer: Medicaid Other | Admitting: Advanced Practice Midwife

## 2019-09-10 VITALS — BP 126/70 | HR 93

## 2019-09-10 DIAGNOSIS — Z3482 Encounter for supervision of other normal pregnancy, second trimester: Secondary | ICD-10-CM

## 2019-09-10 DIAGNOSIS — Z3A15 15 weeks gestation of pregnancy: Secondary | ICD-10-CM

## 2019-09-10 DIAGNOSIS — Z1379 Encounter for other screening for genetic and chromosomal anomalies: Secondary | ICD-10-CM

## 2019-09-10 DIAGNOSIS — Z363 Encounter for antenatal screening for malformations: Secondary | ICD-10-CM

## 2019-09-10 MED ORDER — DOXYLAMINE-PYRIDOXINE 10-10 MG PO TBEC
DELAYED_RELEASE_TABLET | ORAL | 6 refills | Status: DC
Start: 2019-09-10 — End: 2020-02-19

## 2019-09-10 MED ORDER — ONDANSETRON 4 MG PO TBDP
4.0000 mg | ORAL_TABLET | Freq: Four times a day (QID) | ORAL | 2 refills | Status: DC | PRN
Start: 2019-09-10 — End: 2019-11-27

## 2019-09-10 NOTE — Patient Instructions (Signed)
Kristin Morgan, I greatly value your feedback.  If you receive a survey following your visit with [name] today, we appreciate you taking the time to fill it out.  Thanks, Nigel Berthold, CNM     [place]!!! It is now [place] at [facility] ([place], [place] [address]) Entrance located off of [address] parking   Go to [email or web address] to register for FREE online childbirth classes    Second Trimester of Pregnancy The second trimester is from week 14 through week 27 (months 4 through 6). The second trimester is often a time when you feel your best. Your body has adjusted to being pregnant, and you begin to feel better physically. Usually, morning sickness has lessened or quit completely, you may have more energy, and you may have an increase in appetite. The second trimester is also a time when the fetus is growing rapidly. At the end of the sixth month, the fetus is about 9 inches long and weighs about 1 pounds. You will likely begin to feel the baby move (quickening) between 16 and 20 weeks of pregnancy. Body changes during your second trimester Your body continues to go through many changes during your second trimester. The changes vary from woman to woman.  Your weight will continue to increase. You will notice your lower abdomen bulging out.  You may begin to get stretch marks on your hips, abdomen, and breasts.  You may develop headaches that can be relieved by medicines. The medicines should be approved by your health care provider.  You may urinate more often because the fetus is pressing on your bladder.  You may develop or continue to have heartburn as a result of your pregnancy.  You may develop constipation because certain hormones are causing the muscles that push waste through your intestines to slow down.  You may develop hemorrhoids or swollen, bulging veins (varicose veins).  You may have back  pain. This is caused by: ? Weight gain. ? Pregnancy hormones that are relaxing the joints in your pelvis. ? A shift in weight and the muscles that support your balance.  Your breasts will continue to grow and they will continue to become tender.  Your gums may bleed and may be sensitive to brushing and flossing.  Dark spots or blotches (chloasma, mask of pregnancy) may develop on your face. This will likely fade after the baby is born.  A dark line from your belly button to the pubic area (linea nigra) may appear. This will likely fade after the baby is born.  You may have changes in your hair. These can include thickening of your hair, rapid growth, and changes in texture. Some women also have hair loss during or after pregnancy, or hair that feels dry or thin. Your hair will most likely return to normal after your baby is born.  What to expect at prenatal visits During a routine prenatal visit:  You will be weighed to make sure you and the fetus are growing normally.  Your blood pressure will be taken.  Your abdomen will be measured to track your baby's growth.  The fetal heartbeat will be listened to.  Any test results from the previous visit will be discussed.  Your health care provider may ask you:  How you are feeling.  If you are feeling the baby move.  If you have had any abnormal symptoms, such as leaking fluid, bleeding, severe headaches, or abdominal  cramping.  If you are using any tobacco products, including cigarettes, chewing tobacco, and electronic cigarettes.  If you have any questions.  Other tests that may be performed during your second trimester include:  Blood tests that check for: ? Low iron levels (anemia). ? High blood sugar that affects pregnant women (gestational diabetes) between 48 and 28 weeks. ? Rh antibodies. This is to check for a protein on red blood cells (Rh factor).  Urine tests to check for infections, diabetes, or protein in the  urine.  An ultrasound to confirm the proper growth and development of the baby.  An amniocentesis to check for possible genetic problems.  Fetal screens for spina bifida and Down syndrome.  HIV (human immunodeficiency virus) testing. Routine prenatal testing includes screening for HIV, unless you choose not to have this test.  Follow these instructions at home: Medicines  Follow your health care provider's instructions regarding medicine use. Specific medicines may be either safe or unsafe to take during pregnancy.  Take a prenatal vitamin that contains at least 600 micrograms (mcg) of folic acid.  If you develop constipation, try taking a stool softener if your health care provider approves. Eating and drinking  Eat a balanced diet that includes fresh fruits and vegetables, whole grains, good sources of protein such as meat, eggs, or tofu, and low-fat dairy. Your health care provider will help you determine the amount of weight gain that is right for you.  Avoid raw meat and uncooked cheese. These carry germs that can cause birth defects in the baby.  If you have low calcium intake from food, talk to your health care provider about whether you should take a daily calcium supplement.  Limit foods that are high in fat and processed sugars, such as fried and sweet foods.  To prevent constipation: ? Drink enough fluid to keep your urine clear or pale yellow. ? Eat foods that are high in fiber, such as fresh fruits and vegetables, whole grains, and beans. Activity  Exercise only as directed by your health care provider. Most women can continue their usual exercise routine during pregnancy. Try to exercise for 30 minutes at least 5 days a week. Stop exercising if you experience uterine contractions.  Avoid heavy lifting, wear low heel shoes, and practice good posture.  A sexual relationship may be continued unless your health care provider directs you otherwise. Relieving pain and  discomfort  Wear a good support bra to prevent discomfort from breast tenderness.  Take warm sitz baths to soothe any pain or discomfort caused by hemorrhoids. Use hemorrhoid cream if your health care provider approves.  Rest with your legs elevated if you have leg cramps or low back pain.  If you develop varicose veins, wear support hose. Elevate your feet for 15 minutes, 3-4 times a day. Limit salt in your diet. Prenatal Care  Write down your questions. Take them to your prenatal visits.  Keep all your prenatal visits as told by your health care provider. This is important. Safety  Wear your seat belt at all times when driving.  Make a list of emergency phone numbers, including numbers for family, friends, the hospital, and police and fire departments. General instructions  Ask your health care provider for a referral to a local prenatal education class. Begin classes no later than the beginning of month 6 of your pregnancy.  Ask for help if you have counseling or nutritional needs during pregnancy. Your health care provider can offer advice or refer  you to specialists for help with various needs.  Do not use hot tubs, steam rooms, or saunas.  Do not douche or use tampons or scented sanitary pads.  Do not cross your legs for long periods of time.  Avoid cat litter boxes and soil used by cats. These carry germs that can cause birth defects in the baby and possibly loss of the fetus by miscarriage or stillbirth.  Avoid all smoking, herbs, alcohol, and unprescribed drugs. Chemicals in these products can affect the formation and growth of the baby.  Do not use any products that contain nicotine or tobacco, such as cigarettes and e-cigarettes. If you need help quitting, ask your health care provider.  Visit your dentist if you have not gone yet during your pregnancy. Use a soft toothbrush to brush your teeth and be gentle when you floss. Contact a health care provider if:  You  have dizziness.  You have mild pelvic cramps, pelvic pressure, or nagging pain in the abdominal area.  You have persistent nausea, vomiting, or diarrhea.  You have a bad smelling vaginal discharge.  You have pain when you urinate. Get help right away if:  You have a fever.  You are leaking fluid from your vagina.  You have spotting or bleeding from your vagina.  You have severe abdominal cramping or pain.  You have rapid weight gain or weight loss.  You have shortness of breath with chest pain.  You notice sudden or extreme swelling of your face, hands, ankles, feet, or legs.  You have not felt your baby move in over an hour.  You have severe headaches that do not go away when you take medicine.  You have vision changes. Summary  The second trimester is from week 14 through week 27 (months 4 through 6). It is also a time when the fetus is growing rapidly.  Your body goes through many changes during pregnancy. The changes vary from woman to woman.  Avoid all smoking, herbs, alcohol, and unprescribed drugs. These chemicals affect the formation and growth your baby.  Do not use any tobacco products, such as cigarettes, chewing tobacco, and e-cigarettes. If you need help quitting, ask your health care provider.  Contact your health care provider if you have any questions. Keep all prenatal visits as told by your health care provider. This is important. This information is not intended to replace advice given to you by your health care provider. Make sure you discuss any questions you have with your health care provider.

## 2019-09-10 NOTE — Progress Notes (Addendum)
   TELEHEALTH OBSTETRICS VISIT ENCOUNTER NOTE  I connected with Kristin Morgan on 09/10/19 at 10:50 AM EDT by telephone at home and verified that I am speaking with the correct person using two identifiers.   I discussed the limitations, risks, security and privacy concerns of performing an evaluation and management service by telephone and the availability of in person appointments. I also discussed with the patient that there may be a patient responsible charge related to this service. The patient expressed understanding and agreed to proceed.  Subjective:  Kristin Morgan is a 24 y.o. G3P2002 at [redacted]w[redacted]d being followed for ongoing prenatal care.  She is currently monitored for the following issues for this low-risk pregnancy and has Post-nasal drip; Morbid obesity with BMI of 40.0-44.9, adult (HCC); and Encounter for supervision of other normal pregnancy, unspecified trimester on their problem list.  Patient reports nausea. Reports fetal movement. Denies any contractions, bleeding or leaking of fluid.   The following portions of the patient's history were reviewed and updated as appropriate: allergies, current medications, past family history, past medical history, past social history, past surgical history and problem list.   Objective:   General:  Alert, oriented and cooperative.   Mental Status: Normal mood and affect perceived. Normal judgment and thought content.  Rest of physical exam deferred due to type of encounter  Assessment and Plan:  Pregnancy: G3P2002 at [redacted]w[redacted]d 1. Genetic screening Will be completed at next visit - INTEGRATED 2  2. Supervision of normal intrauterine pregnancy in multigravida, second trimester Continue with routine care  3. Antenatal screening for malformation using ultrasonics  - US OB Comp + 14 Wk; Future  4. Nausea - Has been taking phenergan which is helpful, but makes her sleepy - Discussed starting Diclegis and Zofran- she agreed to this plan of  care  Preterm labor symptoms and general obstetric precautions including but not limited to vaginal bleeding, contractions, leaking of fluid and fetal movement were reviewed in detail with the patient.  I discussed the assessment and treatment plan with the patient. The patient was provided an opportunity to ask questions and all were answered. The patient agreed with the plan and demonstrated an understanding of the instructions. The patient was advised to call back or seek an in-person office evaluation/go to [facility] at [facility] for any urgent or concerning symptoms. Please refer to After Visit Summary for other counseling recommendations.   I provided 10 minutes of non-face-to-face time during this encounter.  Return for 2.5-3 weeks for , LROB, JT:[id number].  No future appointments.  Fuller Mandril, RN S-NP [facility] [employer], [facility]

## 2019-10-02 ENCOUNTER — Ambulatory Visit (INDEPENDENT_AMBULATORY_CARE_PROVIDER_SITE_OTHER): Payer: Medicaid Other

## 2019-10-02 ENCOUNTER — Encounter: Payer: Self-pay | Admitting: Obstetrics & Gynecology

## 2019-10-02 ENCOUNTER — Ambulatory Visit (INDEPENDENT_AMBULATORY_CARE_PROVIDER_SITE_OTHER): Payer: Medicaid Other | Admitting: Obstetrics & Gynecology

## 2019-10-02 VITALS — BP 115/67 | HR 76 | Wt 228.5 lb

## 2019-10-02 DIAGNOSIS — Z3A18 18 weeks gestation of pregnancy: Secondary | ICD-10-CM | POA: Diagnosis not present

## 2019-10-02 DIAGNOSIS — Z3480 Encounter for supervision of other normal pregnancy, unspecified trimester: Secondary | ICD-10-CM

## 2019-10-02 DIAGNOSIS — Z331 Pregnant state, incidental: Secondary | ICD-10-CM

## 2019-10-02 DIAGNOSIS — Z363 Encounter for antenatal screening for malformations: Secondary | ICD-10-CM

## 2019-10-02 DIAGNOSIS — Z1379 Encounter for other screening for genetic and chromosomal anomalies: Secondary | ICD-10-CM

## 2019-10-02 DIAGNOSIS — Z1389 Encounter for screening for other disorder: Secondary | ICD-10-CM

## 2019-10-02 DIAGNOSIS — Z3482 Encounter for supervision of other normal pregnancy, second trimester: Secondary | ICD-10-CM

## 2019-10-02 LAB — POCT URINALYSIS DIPSTICK OB
Glucose, UA: NEGATIVE
Nitrite, UA: NEGATIVE

## 2019-10-02 MED ORDER — OMEPRAZOLE 20 MG PO CPDR
20.0000 mg | DELAYED_RELEASE_CAPSULE | Freq: Every day | ORAL | 6 refills | Status: DC
Start: 2019-10-02 — End: 2019-12-22

## 2019-10-02 NOTE — Progress Notes (Signed)
[name] 18+6 wks,cephalic,anterior placenta gr 0,normal ovaries,cx 4.4 cm,svp of fluid 4.4 cm,fhr 148 bpm,EFW 252 g 35%,anatomy complete,no obvious abnormalities

## 2019-10-02 NOTE — Progress Notes (Signed)
LOW-RISK PREGNANCY VISIT Patient name: Kristin Morgan MRN [id number]  Date of birth: [birth year] Chief Complaint:   Routine Prenatal Visit ([name] today; 2nd IT)  History of Present Illness:   Kristin Morgan is a 24 y.o. G49P2002 female at [redacted]w[redacted]d with an Estimated Date of Delivery: 02/27/20 being seen today for ongoing management of a low-risk pregnancy.  Depression screen [facility] 2/9 08/21/2019 08/04/2019 07/08/2019 06/02/2018 07/30/2016  Decreased Interest 0 0 0 0 0  Down, Depressed, Hopeless 0 0 0 0 0  PHQ - 2 Score 0 0 0 0 0  Altered sleeping 0 - 1 - -  Tired, decreased energy 0 - 1 - -  Change in appetite 0 - 0 - -  Feeling bad or failure about yourself  0 - 0 - -  Trouble concentrating 0 - 0 - -  Moving slowly or fidgety/restless 0 - 0 - -  Suicidal thoughts 0 - 0 - -  PHQ-9 Score 0 - 2 - -  Difficult doing work/chores Not difficult at all - Not difficult at all - -    Today she reports no complaints. Contractions: Not present. Vag. Bleeding: None.  Movement: Present. denies leaking of fluid. Review of Systems:   Pertinent items are noted in HPI Denies abnormal vaginal discharge w/ itching/odor/irritation, headaches, visual changes, shortness of breath, chest pain, abdominal pain, severe nausea/vomiting, or problems with urination or bowel movements unless otherwise stated above. Pertinent History Reviewed:  Reviewed past medical,surgical, social, obstetrical and family history.  Reviewed problem list, medications and allergies. Physical Assessment:   Vitals:   10/02/19 1249  BP: 115/67  Pulse: 76  Weight: 228 lb 8 oz (103.6 kg)  Body mass index is 40.16 kg/m.        Physical Examination:   General appearance: Well appearing, and in no distress  Mental status: Alert, oriented to person, place, and time  Skin: Warm & dry  Cardiovascular: Normal heart rate noted  Respiratory: Normal respiratory effort, no distress  Abdomen: Soft, gravid, nontender  Pelvic: Cervical exam deferred          Extremities: Edema: None  Fetal Status:     Movement: Present    Chaperone: n/a    Results for orders placed or performed in visit on 10/02/19 (from the past 24 hour(s))  POC Urinalysis Dipstick OB   Collection Time: 10/02/19 12:51 PM  Result Value Ref Range   Color, UA     Clarity, UA     Glucose, UA Negative Negative   Bilirubin, UA     Ketones, UA 2+    Spec Grav, UA     Blood, UA trace    pH, UA     POC,PROTEIN,UA Trace Negative, Trace, Small (1+), Moderate (2+), Large (3+), 4+   Urobilinogen, UA     Nitrite, UA neg    Leukocytes, UA Trace (A) Negative   Appearance     Odor      Assessment & Plan:  1) Low-risk pregnancy G3P2002 at [redacted]w[redacted]d with an Estimated Date of Delivery: 02/27/20      Meds:  Meds ordered this encounter  Medications  . omeprazole (PRILOSEC) 20 MG capsule    Sig: Take 1 capsule (20 mg total) by mouth daily. 1 tablet a day    Dispense:  30 capsule    Refill:  6   Labs/procedures today: sonogram  Plan:  Continue routine obstetrical care  Next visit: prefers in person    Reviewed: Preterm labor symptoms and  general obstetric precautions including but not limited to vaginal bleeding, contractions, leaking of fluid and fetal movement were reviewed in detail with the patient.  All questions were answered.  home bp cuff. Rx faxed to . Check bp weekly, let us know if >140/90.   Follow-up: Return in about 4 weeks (around 10/30/2019) for [place].  Orders Placed This Encounter  Procedures  . INTEGRATED 2  . POC Urinalysis Dipstick OB   Florian Buff  10/02/2019 1:07 PM

## 2019-10-27 ENCOUNTER — Encounter: Payer: Self-pay | Admitting: *Deleted

## 2019-10-30 ENCOUNTER — Encounter: Payer: Self-pay | Admitting: Women's Health

## 2019-10-30 ENCOUNTER — Ambulatory Visit (INDEPENDENT_AMBULATORY_CARE_PROVIDER_SITE_OTHER): Payer: Medicaid Other | Admitting: Women's Health

## 2019-10-30 VITALS — BP 130/72 | HR 80 | Wt 231.0 lb

## 2019-10-30 DIAGNOSIS — Z3480 Encounter for supervision of other normal pregnancy, unspecified trimester: Secondary | ICD-10-CM

## 2019-10-30 DIAGNOSIS — Z3482 Encounter for supervision of other normal pregnancy, second trimester: Secondary | ICD-10-CM

## 2019-10-30 DIAGNOSIS — Z3A22 22 weeks gestation of pregnancy: Secondary | ICD-10-CM

## 2019-10-30 DIAGNOSIS — Z1389 Encounter for screening for other disorder: Secondary | ICD-10-CM

## 2019-10-30 DIAGNOSIS — Z331 Pregnant state, incidental: Secondary | ICD-10-CM

## 2019-10-30 LAB — POCT URINALYSIS DIPSTICK OB
Blood, UA: NEGATIVE
Glucose, UA: NEGATIVE
Ketones, UA: NEGATIVE
Leukocytes, UA: NEGATIVE
Nitrite, UA: NEGATIVE
POC,PROTEIN,UA: NEGATIVE

## 2019-10-30 NOTE — Progress Notes (Signed)
LOW-RISK PREGNANCY VISIT Patient name: Kristin Morgan MRN [id number]  Date of birth: 03-13-1996 Chief Complaint:   Routine Prenatal Visit  History of Present Illness:   Kristin Morgan is a 24 y.o. G57P2002 female at [redacted]w[redacted]d with an Estimated Date of Delivery: 02/27/20 being seen today for ongoing management of a low-risk pregnancy.  Depression screen [facility] 2/9 08/21/2019 08/04/2019 07/08/2019 06/02/2018 07/30/2016  Decreased Interest 0 0 0 0 0  Down, Depressed, Hopeless 0 0 0 0 0  PHQ - 2 Score 0 0 0 0 0  Altered sleeping 0 - 1 - -  Tired, decreased energy 0 - 1 - -  Change in appetite 0 - 0 - -  Feeling bad or failure about yourself  0 - 0 - -  Trouble concentrating 0 - 0 - -  Moving slowly or fidgety/restless 0 - 0 - -  Suicidal thoughts 0 - 0 - -  PHQ-9 Score 0 - 2 - -  Difficult doing work/chores Not difficult at all - Not difficult at all - -    Today she reports no complaints. Contractions: Not present. Vag. Bleeding: None.  Movement: Present. denies leaking of fluid. Review of Systems:   Pertinent items are noted in HPI Denies abnormal vaginal discharge w/ itching/odor/irritation, headaches, visual changes, shortness of breath, chest pain, abdominal pain, severe nausea/vomiting, or problems with urination or bowel movements unless otherwise stated above. Pertinent History Reviewed:  Reviewed past medical,surgical, social, obstetrical and family history.  Reviewed problem list, medications and allergies. Physical Assessment:   Vitals:   10/30/19 1228  BP: (!) 130/72  Pulse: 80  Weight: (!) 231 lb (104.8 kg)  Body mass index is 40.6 kg/m.        Physical Examination:   General appearance: Well appearing, and in no distress  Mental status: Alert, oriented to person, place, and time  Skin: Warm & dry  Cardiovascular: Normal heart rate noted  Respiratory: Normal respiratory effort, no distress  Abdomen: Soft, gravid, nontender  Pelvic: Cervical exam deferred         Extremities:  Edema: None  Fetal Status: Fetal Heart Rate (bpm): 134 Fundal Height: 24 cm Movement: Present    Chaperone: n/a    Results for orders placed or performed in visit on 10/30/19 (from the past 24 hour(s))  POC Urinalysis Dipstick OB   Collection Time: 10/30/19 12:27 PM  Result Value Ref Range   Color, UA     Clarity, UA     Glucose, UA Negative Negative   Bilirubin, UA     Ketones, UA n    Spec Grav, UA     Blood, UA n    pH, UA     POC,PROTEIN,UA Negative Negative, Trace, Small (1+), Moderate (2+), Large (3+), 4+   Urobilinogen, UA     Nitrite, UA n    Leukocytes, UA Negative Negative   Appearance     Odor      Assessment & Plan:  1) Low-risk pregnancy G3P2002 at [redacted]w[redacted]d with an Estimated Date of Delivery: 02/27/20   Meds: No orders of the defined types were placed in this encounter.  Labs/procedures today: none  Plan:  Continue routine obstetrical care  Next visit: prefers will be in person for pn2    Reviewed: Preterm labor symptoms and general obstetric precautions including but not limited to vaginal bleeding, contractions, leaking of fluid and fetal movement were reviewed in detail with the patient.  All questions were answered. Has home bp cuff.  Check bp weekly, let us know if >140/90.   Follow-up: Return in about 4 weeks (around 11/27/2019) for LROB, PN2, CNM.  Orders Placed This Encounter  Procedures  . POC Urinalysis Dipstick OB   Cheral Marker CNM, [facility] 10/30/2019 1:03 PM

## 2019-10-30 NOTE — Patient Instructions (Signed)
Kristin Morgan, I greatly value your feedback.  If you receive a survey following your visit with [name] today, we appreciate you taking the time to fill it out.  Thanks, Joellyn Haff, CNM, WHNP-BC   You will have your sugar test next visit.  Please do not eat or drink anything after midnight the night before you come, not even water.  You will be here for at least two hours.  Please make an appointment online for the bloodwork at [email or web address] for 8:30am (or as close to this as possible). Make sure you select the [facility] service center. The day of the appointment, check in with our office first, then you will go to [facility] to start the sugar test.    [facility] [place], [place] [address]) Entrance C, located off of [address] valet parking  Go to [email or web address] to register for FREE online childbirth classes   Call the office [phone]) or go to [facility] if:  You begin to have strong, frequent contractions  Your water breaks.  Sometimes it is a big gush of fluid, sometimes it is just a trickle that keeps getting your panties wet or running down your legs  You have vaginal bleeding.  It is normal to have a small amount of spotting if your cervix was checked.   You don't feel your baby moving like normal.  If you don't, get you something to eat and drink and lay down and focus on feeling your baby move.   If your baby is still not moving like normal, you should call the office or go to [facility].  [place] Pediatricians/Family Doctors:  [facility] [phone]            [facility] [phone]                 [facility] [phone] (usually not accepting new patients unless you have family there already, you are always welcome to call and ask)       [facility] [phone]       [facility] Pediatricians/Family Doctors:   [facility]:  [phone]  [facility]: [phone]  [facility]: [phone]  [facility] Doctors:   [facility]: [phone]   [facility]: [phone]  [facility] Doctors:  [facility]: [phone]   Home Blood Pressure Monitoring for Patients   Your provider has recommended that you check your blood pressure (BP) at least once a week at home. If you do not have a blood pressure cuff at home, one will be provided for you. Contact your provider if you have not received your monitor within 1 week.   Helpful Tips for Accurate Home Blood Pressure Checks   Don't smoke, exercise, or drink caffeine 30 minutes before checking your BP  Use the restroom before checking your BP (a full bladder can raise your pressure)  Relax in a comfortable upright chair  Feet on the ground  Left arm resting comfortably on a flat surface at the level of your heart  Legs uncrossed  Back supported  Sit quietly and don't talk  Place the cuff on your bare arm  Adjust snuggly, so that only two fingertips can fit between your skin and the top of the cuff  Check 2 readings separated by at least one minute  Keep a log of your BP readings  For a visual, please reference  this diagram: http://ccnc.care/bpdiagram  Provider Name: [facility]     Phone: [phone]  Zone 1: ALL CLEAR  Continue to monitor your symptoms:  . BP reading is less than 140 (top number) or less than 90 (bottom number)  . No right upper stomach pain . No headaches or seeing spots . No feeling nauseated or throwing up . No swelling in face and hands  Zone 2: CAUTION Call your doctor's office for any of the following:  . BP reading is greater than 140 (top number) or greater than 90 (bottom number)  . Stomach pain under your ribs in the middle or right side . Headaches or seeing spots . Feeling nauseated or throwing up . Swelling in  face and hands  Zone 3: EMERGENCY  Seek immediate medical care if you have any of the following:  . BP reading is greater than160 (top number) or greater than 110 (bottom number) . Severe headaches not improving with Tylenol . Serious difficulty catching your breath . Any worsening symptoms from Zone 2   Second Trimester of Pregnancy The second trimester is from week 13 through week 28, months 4 through 6. The second trimester is often a time when you feel your best. Your body has also adjusted to being pregnant, and you begin to feel better physically. Usually, morning sickness has lessened or quit completely, you may have more energy, and you may have an increase in appetite. The second trimester is also a time when the fetus is growing rapidly. At the end of the sixth month, the fetus is about 9 inches long and weighs about 1 pounds. You will likely begin to feel the baby move (quickening) between 18 and 20 weeks of the pregnancy. BODY CHANGES Your body goes through many changes during pregnancy. The changes vary from woman to woman.   Your weight will continue to increase. You will notice your lower abdomen bulging out.  You may begin to get stretch marks on your hips, abdomen, and breasts.  You may develop headaches that can be relieved by medicines approved by your health care provider.  You may urinate more often because the fetus is pressing on your bladder.  You may develop or continue to have heartburn as a result of your pregnancy.  You may develop constipation because certain hormones are causing the muscles that push waste through your intestines to slow down.  You may develop hemorrhoids or swollen, bulging veins (varicose veins).  You may have back pain because of the weight gain and pregnancy hormones relaxing your joints between the bones in your pelvis and as a result of a shift in weight and the muscles that support your balance.  Your breasts will continue to grow  and be tender.  Your gums may bleed and may be sensitive to brushing and flossing.  Dark spots or blotches (chloasma, mask of pregnancy) may develop on your face. This will likely fade after the baby is born.  A dark line from your belly button to the pubic area (linea nigra) may appear. This will likely fade after the baby is born.  You may have changes in your hair. These can include thickening of your hair, rapid growth, and changes in texture. Some women also have hair loss during or after pregnancy, or hair that feels dry or thin. Your hair will most likely return to normal after your baby is born. WHAT TO EXPECT AT YOUR PRENATAL VISITS During a routine prenatal visit:  You will   be weighed to make sure you and the fetus are growing normally.  Your blood pressure will be taken.  Your abdomen will be measured to track your baby's growth.  The fetal heartbeat will be listened to.  Any test results from the previous visit will be discussed. Your health care provider may ask you:  How you are feeling.  If you are feeling the baby move.  If you have had any abnormal symptoms, such as leaking fluid, bleeding, severe headaches, or abdominal cramping.  If you have any questions. Other tests that may be performed during your second trimester include:  Blood tests that check for:  Low iron levels (anemia).  Gestational diabetes (between 24 and 28 weeks).  Rh antibodies.  Urine tests to check for infections, diabetes, or protein in the urine.  An ultrasound to confirm the proper growth and development of the baby.  An amniocentesis to check for possible genetic problems.  Fetal screens for spina bifida and Down syndrome. HOME CARE INSTRUCTIONS   Avoid all smoking, herbs, alcohol, and unprescribed drugs. These chemicals affect the formation and growth of the baby.  Follow your health care provider's instructions regarding medicine use. There are medicines that are either  safe or unsafe to take during pregnancy.  Exercise only as directed by your health care provider. Experiencing uterine cramps is a good sign to stop exercising.  Continue to eat regular, healthy meals.  Wear a good support bra for breast tenderness.  Do not use hot tubs, steam rooms, or saunas.  Wear your seat belt at all times when driving.  Avoid raw meat, uncooked cheese, cat litter boxes, and soil used by cats. These carry germs that can cause birth defects in the baby.  Take your prenatal vitamins.  Try taking a stool softener (if your health care provider approves) if you develop constipation. Eat more high-fiber foods, such as fresh vegetables or fruit and whole grains. Drink plenty of fluids to keep your urine clear or pale yellow.  Take warm sitz baths to soothe any pain or discomfort caused by hemorrhoids. Use hemorrhoid cream if your health care provider approves.  If you develop varicose veins, wear support hose. Elevate your feet for 15 minutes, 3-4 times a day. Limit salt in your diet.  Avoid heavy lifting, wear low heel shoes, and practice good posture.  Rest with your legs elevated if you have leg cramps or low back pain.  Visit your dentist if you have not gone yet during your pregnancy. Use a soft toothbrush to brush your teeth and be gentle when you floss.  A sexual relationship may be continued unless your health care provider directs you otherwise.  Continue to go to all your prenatal visits as directed by your health care provider. SEEK MEDICAL CARE IF:   You have dizziness.  You have mild pelvic cramps, pelvic pressure, or nagging pain in the abdominal area.  You have persistent nausea, vomiting, or diarrhea.  You have a bad smelling vaginal discharge.  You have pain with urination. SEEK IMMEDIATE MEDICAL CARE IF:   You have a fever.  You are leaking fluid from your vagina.  You have spotting or bleeding from your vagina.  You have severe  abdominal cramping or pain.  You have rapid weight gain or loss.  You have shortness of breath with chest pain.  You notice sudden or extreme swelling of your face, hands, ankles, feet, or legs.  You have not felt your baby move in   over an hour.  You have severe headaches that do not go away with medicine.  You have vision changes. Document Released: 03/20/2001 Document Revised: 03/31/2013 Document Reviewed: 05/27/2012 [facility] Patient Information 2015 [place], [place]. This information is not intended to replace advice given to you by your health care provider. Make sure you discuss any questions you have with your health care provider.

## 2019-11-26 NOTE — Progress Notes (Signed)
Patient ID: Kristin Morgan, female   DOB: December 23, 1995, 24 y.o.   MRN: [id number]    LOW-RISK PREGNANCY VISIT Patient name: Kristin Morgan MRN [id number]  Date of birth: [birth year] Chief Complaint:   No chief complaint on file.  History of Present Illness:   Kristin Morgan is a 24 y.o. G50P2002 female at [redacted]w[redacted]d with an Estimated Date of Delivery: 02/27/20 being seen today for ongoing management of a low-risk pregnancy.  Depression screen [facility] 2/9 08/21/2019 08/04/2019 07/08/2019 06/02/2018 07/30/2016  Decreased Interest 0 0 0 0 0  Down, Depressed, Hopeless 0 0 0 0 0  PHQ - 2 Score 0 0 0 0 0  Altered sleeping 0 - 1 - -  Tired, decreased energy 0 - 1 - -  Change in appetite 0 - 0 - -  Feeling bad or failure about yourself  0 - 0 - -  Trouble concentrating 0 - 0 - -  Moving slowly or fidgety/restless 0 - 0 - -  Suicidal thoughts 0 - 0 - -  PHQ-9 Score 0 - 2 - -  Difficult doing work/chores Not difficult at all - Not difficult at all - -    Today she reports no complaints.  .  .   . denies leaking of fluid. Review of Systems:   Pertinent items are noted in HPI Denies abnormal vaginal discharge w/ itching/odor/irritation, headaches, visual changes, shortness of breath, chest pain, abdominal pain, severe nausea/vomiting, or problems with urination or bowel movements unless otherwise stated above. Pertinent History Reviewed:  Reviewed past medical,surgical, social, obstetrical and family history.  Reviewed problem list, medications and allergies. Physical Assessment:  There were no vitals filed for this visit.There is no height or weight on file to calculate BMI.        Physical Examination:   General appearance: Well appearing, and in no distress  Mental status: Alert, oriented to person, place, and time  Skin: Warm & dry  Cardiovascular: Normal heart rate noted  Respiratory: Normal respiratory effort, no distress  Abdomen: Soft, gravid, nontender 32 cm  Pelvic: Cervical exam deferred          Extremities:    Fetal Status:        + fhr  Chaperone: Kristin Morgan    No results found for this or any previous visit (from the past 24 hour(s)).  Assessment & Plan:  1) Low-risk pregnancy G3P2002 at [redacted]w[redacted]d with an Estimated Date of Delivery: 02/27/20   2) Bottle. Girl, , IUD Liletta. May consider 4-6 wk insertion.   Meds: No orders of the defined types were placed in this encounter.  Labs/procedures today: PN2  Plan:  Continue routine obstetrical care Next visit: prefers online for next visit.    Reviewed: Preterm labor symptoms and general obstetric precautions including but not limited to vaginal bleeding, contractions, leaking of fluid and fetal movement were reviewed in detail with the patient.  All questions were answered. has home bp cuff.. Check bp weekly, let us know if >140/90.   Follow-up: No follow-ups on file.  No orders of the defined types were placed in this encounter.  11/26/2019 10:12 AM  By signing my name below, I, Kristin Morgan, attest that this documentation has been prepared under the direction and in the presence of Tilda Burrow, MD. Electronically Signed: Pietro Morgan, Medical Scribe. 11/26/19. 10:12 AM.  I personally performed the services described in this documentation, which was SCRIBED in my presence. The recorded information has been reviewed and considered accurate.  It has been edited as necessary during review. Tilda Burrow, MD

## 2019-11-27 ENCOUNTER — Ambulatory Visit (INDEPENDENT_AMBULATORY_CARE_PROVIDER_SITE_OTHER): Payer: Medicaid Other | Admitting: Obstetrics and Gynecology

## 2019-11-27 ENCOUNTER — Other Ambulatory Visit: Payer: Medicaid Other

## 2019-11-27 ENCOUNTER — Other Ambulatory Visit: Payer: Self-pay

## 2019-11-27 ENCOUNTER — Encounter: Payer: Self-pay | Admitting: Obstetrics and Gynecology

## 2019-11-27 VITALS — BP 123/61 | HR 93 | Wt 229.4 lb

## 2019-11-27 DIAGNOSIS — Z1389 Encounter for screening for other disorder: Secondary | ICD-10-CM

## 2019-11-27 DIAGNOSIS — Z331 Pregnant state, incidental: Secondary | ICD-10-CM

## 2019-11-27 DIAGNOSIS — Z3A26 26 weeks gestation of pregnancy: Secondary | ICD-10-CM

## 2019-11-27 DIAGNOSIS — Z131 Encounter for screening for diabetes mellitus: Secondary | ICD-10-CM

## 2019-11-27 DIAGNOSIS — Z23 Encounter for immunization: Secondary | ICD-10-CM | POA: Diagnosis not present

## 2019-11-27 DIAGNOSIS — Z3480 Encounter for supervision of other normal pregnancy, unspecified trimester: Secondary | ICD-10-CM | POA: Diagnosis not present

## 2019-11-27 LAB — POCT URINALYSIS DIPSTICK OB
Blood, UA: NEGATIVE
Glucose, UA: NEGATIVE
Ketones, UA: NEGATIVE
Leukocytes, UA: NEGATIVE
Nitrite, UA: NEGATIVE
POC,PROTEIN,UA: NEGATIVE

## 2019-11-27 NOTE — Patient Instructions (Signed)
Levonorgestrel intrauterine device (IUD) What is this medicine? LEVONORGESTREL IUD (LEE voe nor jes trel) is a contraceptive (birth control) device. The device is placed inside the uterus by a healthcare professional. It is used to prevent pregnancy. This device can also be used to treat heavy bleeding that occurs during your period. This medicine may be used for other purposes; ask your health care provider or pharmacist if you have questions. COMMON BRAND NAME(S): Kyleena, LILETTA, Mirena, Skyla What should I tell my health care provider before I take this medicine? They need to know if you have any of these conditions:  abnormal Pap smear  cancer of the breast, uterus, or cervix  diabetes  endometritis  genital or pelvic infection now or in the past  have more than one sexual partner or your partner has more than one partner  heart disease  history of an ectopic or tubal pregnancy  immune system problems  IUD in place  liver disease or tumor  problems with blood clots or take blood-thinners  seizures  use intravenous drugs  uterus of unusual shape  vaginal bleeding that has not been explained  an unusual or allergic reaction to levonorgestrel, other hormones, silicone, or polyethylene, medicines, foods, dyes, or preservatives  pregnant or trying to get pregnant  breast-feeding How should I use this medicine? This device is placed inside the uterus by a health care professional. Talk to your pediatrician regarding the use of this medicine in children. Special care may be needed. Overdosage: If you think you have taken too much of this medicine contact a poison control center or emergency room at once. NOTE: This medicine is only for you. Do not share this medicine with others. What if I miss a dose? This does not apply. Depending on the brand of device you have inserted, the device will need to be replaced every 3 to 6 years if you wish to continue using this type  of birth control. What may interact with this medicine? Do not take this medicine with any of the following medications:  amprenavir  bosentan  fosamprenavir This medicine may also interact with the following medications:  aprepitant  armodafinil  barbiturate medicines for inducing sleep or treating seizures  bexarotene  boceprevir  griseofulvin  medicines to treat seizures like carbamazepine, ethotoin, felbamate, oxcarbazepine, phenytoin, topiramate  modafinil  pioglitazone  rifabutin  rifampin  rifapentine  some medicines to treat HIV infection like atazanavir, efavirenz, indinavir, lopinavir, nelfinavir, tipranavir, ritonavir  [employer]. Shereda Graw's wort  warfarin This list may not describe all possible interactions. Give your health care provider a list of all the medicines, herbs, non-prescription drugs, or dietary supplements you use. Also tell them if you smoke, drink alcohol, or use illegal drugs. Some items may interact with your medicine. What should I watch for while using this medicine? Visit your doctor or health care professional for regular check ups. See your doctor if you or your partner has sexual contact with others, becomes HIV positive, or gets a sexual transmitted disease. This product does not protect you against HIV infection (AIDS) or other sexually transmitted diseases. You can check the placement of the IUD yourself by reaching up to the top of your vagina with clean fingers to feel the threads. Do not pull on the threads. It is a good habit to check placement after each menstrual period. Call your doctor right away if you feel more of the IUD than just the threads or if you cannot feel the threads at   all. The IUD may come out by itself. You may become pregnant if the device comes out. If you notice that the IUD has come out use a backup birth control method like condoms and call your health care provider. Using tampons will not change the position of the  IUD and are okay to use during your period. This IUD can be safely scanned with magnetic resonance imaging (MRI) only under specific conditions. Before you have an MRI, tell your healthcare provider that you have an IUD in place, and which type of IUD you have in place. What side effects may I notice from receiving this medicine? Side effects that you should report to your doctor or health care professional as soon as possible:  allergic reactions like skin rash, itching or hives, swelling of the face, lips, or tongue  fever, flu-like symptoms  genital sores  high blood pressure  no menstrual period for 6 weeks during use  pain, swelling, warmth in the leg  pelvic pain or tenderness  severe or sudden headache  signs of pregnancy  stomach cramping  sudden shortness of breath  trouble with balance, talking, or walking  unusual vaginal bleeding, discharge  yellowing of the eyes or skin Side effects that usually do not require medical attention (report to your doctor or health care professional if they continue or are bothersome):  acne  breast pain  change in sex drive or performance  changes in weight  cramping, dizziness, or faintness while the device is being inserted  headache  irregular menstrual bleeding within first 3 to 6 months of use  nausea This list may not describe all possible side effects. Call your doctor for medical advice about side effects. You may report side effects to FDA at 1-800-FDA-1088. Where should I keep my medicine? This does not apply. NOTE: This sheet is a summary. It may not cover all possible information. If you have questions about this medicine, talk to your doctor, pharmacist, or health care provider.  2020 Elsevier/Gold Standard (2018-02-04 13:22:01)  

## 2019-11-28 LAB — CBC
Hematocrit: 34.7 % (ref 34.0–46.6)
Hemoglobin: 10.7 g/dL — ABNORMAL LOW (ref 11.1–15.9)
MCH: 22.4 pg — ABNORMAL LOW (ref 26.6–33.0)
MCHC: 30.8 g/dL — ABNORMAL LOW (ref 31.5–35.7)
MCV: 73 fL — ABNORMAL LOW (ref 79–97)
Platelets: 282 10*3/uL (ref 150–450)
RBC: 4.78 x10E6/uL (ref 3.77–5.28)
RDW: 16 % — ABNORMAL HIGH (ref 11.7–15.4)
WBC: 11.7 10*3/uL — ABNORMAL HIGH (ref 3.4–10.8)

## 2019-11-28 LAB — GLUCOSE TOLERANCE, 2 HOURS W/ 1HR
Glucose, 1 hour: 147 mg/dL (ref 65–179)
Glucose, 2 hour: 132 mg/dL (ref 65–152)
Glucose, Fasting: 83 mg/dL (ref 65–91)

## 2019-11-28 LAB — RPR: RPR Ser Ql: NONREACTIVE

## 2019-11-28 LAB — ANTIBODY SCREEN: Antibody Screen: NEGATIVE

## 2019-11-28 LAB — HIV ANTIBODY (ROUTINE TESTING W REFLEX): HIV Screen 4th Generation wRfx: NONREACTIVE

## 2019-12-02 ENCOUNTER — Encounter: Payer: Self-pay | Admitting: *Deleted

## 2019-12-02 ENCOUNTER — Telehealth: Payer: Self-pay | Admitting: Women's Health

## 2019-12-02 ENCOUNTER — Telehealth: Payer: Self-pay | Admitting: *Deleted

## 2019-12-02 NOTE — Telephone Encounter (Signed)
Patient called stating that her son was diagnosed with RSV and now she is feeling sick. Pt called the after hours nurse and they stated for her to call when we open to get advise. Please contact pt

## 2019-12-02 NOTE — Telephone Encounter (Signed)
Patient states her one son has RSV and she is pretty sure her other one does as well but is waiting for the Ped Dr to call back.  States she has a low grade fever, nasal congestion and cough.  Advised she could get COVID tested but based on her symptoms, she could go ahead and quarantine for 10 days, treat symptoms with OTC meds and push fluids.  Advised to call us or go to hospital if symptoms worsen.  Pt verbalized understanding and agreeable to plan.

## 2019-12-09 ENCOUNTER — Telehealth: Payer: Self-pay | Admitting: Women's Health

## 2019-12-09 NOTE — Telephone Encounter (Signed)
Spoke with patient she states that she noticed a mucous discharge with a hint of pink. She denies recent intercourse, no odor, no itching or burning. Baby is moving well, not having any pain. I offered an appointment to have it evaluated. Patient declines at this time states that she will keep an eye on it and let us know if anything changes. Gave precautions for when to call or go to [facility] (decreased fetal movement, bleeding like a period, leaking of fluid, or contractions.) Patient has no other questions or concerns at this time.

## 2019-12-09 NOTE — Telephone Encounter (Signed)
Patient stated she was having a dark color discharge since yesterday and this morning she noticed it had some blood in it.

## 2019-12-22 ENCOUNTER — Telehealth: Payer: Self-pay | Admitting: *Deleted

## 2019-12-22 ENCOUNTER — Ambulatory Visit (INDEPENDENT_AMBULATORY_CARE_PROVIDER_SITE_OTHER): Payer: Medicaid Other | Admitting: *Deleted

## 2019-12-22 ENCOUNTER — Other Ambulatory Visit: Payer: Self-pay

## 2019-12-22 VITALS — BP 127/73 | HR 95 | Wt 233.0 lb

## 2019-12-22 DIAGNOSIS — Z0130 Encounter for examination of blood pressure without abnormal findings: Secondary | ICD-10-CM

## 2019-12-22 DIAGNOSIS — Z3A30 30 weeks gestation of pregnancy: Secondary | ICD-10-CM

## 2019-12-22 DIAGNOSIS — Z3493 Encounter for supervision of normal pregnancy, unspecified, third trimester: Secondary | ICD-10-CM

## 2019-12-22 NOTE — Telephone Encounter (Signed)
Patient called stating she has a appointment upcoming up on 9/27 patient states she needs to come in and be seen sooner. Patient states her blood pressure is running high today 141/73 and she feels dizzy. Patient also states she had a lot of cramping yesterday. Please advise [phone]

## 2019-12-22 NOTE — Telephone Encounter (Signed)
Feels dizzy today. BP 141/73. Had some pain yesterday. No pain today. + pressure. 141/84. Some headache. I spoke with Selena Batten, CNM. Pt was advised to come in today for a BP check with nurse and bring her cuff with her. Call was transferred to [facility] for appt. JSY

## 2019-12-22 NOTE — Progress Notes (Addendum)
   NURSE VISIT- BLOOD PRESSURE CHECK  SUBJECTIVE:  Kristin Morgan is a 24 y.o. G30P2002 female here for BP check. She is [redacted]w[redacted]d pregnant    HYPERTENSION ROS:  Pregnant/postpartum:  . Severe headaches that don't go away with tylenol/other medicines: No  . Visual changes (seeing spots/double/blurred vision) Yes  . Severe pain under right breast breast or in center of upper chest No  . Severe nausea/vomiting No  . Taking medicines as instructed not applicable  OBJECTIVE:  BP 127/73   Pulse 95   Wt 233 lb (105.7 kg)   LMP 05/16/2019   BMI 40.95 kg/m   Appearance alert, well appearing, and in no distress.  ASSESSMENT: Pregnancy [redacted]w[redacted]d  blood pressure check  PLAN: Discussed with Joellyn Haff, CNM, [facility]   Recommendations: no changes needed   Follow-up: as scheduled   Nance Pear  12/22/2019 11:35 AM   Chart reviewed for nurse visit. Agree with plan of care.  Cheral Marker, [place] 12/22/2019 1:13 PM

## 2019-12-25 ENCOUNTER — Telehealth: Payer: Medicaid Other | Admitting: Women's Health

## 2020-01-04 ENCOUNTER — Encounter: Payer: Self-pay | Admitting: Women's Health

## 2020-01-04 ENCOUNTER — Telehealth (INDEPENDENT_AMBULATORY_CARE_PROVIDER_SITE_OTHER): Payer: Medicaid Other | Admitting: Women's Health

## 2020-01-04 VITALS — BP 130/69 | HR 96

## 2020-01-04 DIAGNOSIS — Z3480 Encounter for supervision of other normal pregnancy, unspecified trimester: Secondary | ICD-10-CM

## 2020-01-04 DIAGNOSIS — Z3483 Encounter for supervision of other normal pregnancy, third trimester: Secondary | ICD-10-CM

## 2020-01-04 DIAGNOSIS — Z3A32 32 weeks gestation of pregnancy: Secondary | ICD-10-CM

## 2020-01-04 NOTE — Patient Instructions (Signed)
[name], I greatly value your feedback.  If you receive a survey following your visit with [name] today, we appreciate you taking the time to fill it out.  Thanks, Joellyn Haff, CNM, WHNP-BC  [facility] ([address] [place], [place] [address]) Entrance C, located off of [address] valet parking   Go to [email or web address] to register for FREE online childbirth classes    Call the office [phone]) or go to [facility] if:  You begin to have strong, frequent contractions  Your water breaks.  Sometimes it is a big gush of fluid, sometimes it is just a trickle that keeps getting your panties wet or running down your legs  You have vaginal bleeding.  It is normal to have a small amount of spotting if your cervix was checked.   You don't feel your baby moving like normal.  If you don't, get you something to eat and drink and lay down and focus on feeling your baby move.  You should feel at least 10 movements in 2 hours.  If you don't, you should call the office or go to [facility]   Call the office [phone]) or go to [facility] hospital for these signs of pre-eclampsia:  Severe headache that does not go away with Tylenol  Visual changes- seeing spots, double, blurred vision  Pain under your right breast or upper abdomen that does not go away with Tums or heartburn medicine  Nausea and/or vomiting  Severe swelling in your hands, feet, and face    Home Blood Pressure Monitoring for Patients   Your provider has recommended that you check your blood pressure (BP) at least once a week at home. If you do not have a blood pressure cuff at home, one will be provided for you. Contact your provider if you have not received your monitor within 1 week.   Helpful Tips for Accurate Home Blood Pressure Checks  . Don't smoke, exercise, or drink caffeine 30 minutes before checking your BP . Use the restroom before checking your BP (a full bladder can  raise your pressure) . Relax in a comfortable upright chair . Feet on the ground . Left arm resting comfortably on a flat surface at the level of your heart . Legs uncrossed . Back supported . Sit quietly and don't talk . Place the cuff on your bare arm . Adjust snuggly, so that only two fingertips can fit between your skin and the top of the cuff . Check 2 readings separated by at least one minute . Keep a log of your BP readings . For a visual, please reference this diagram: http://ccnc.care/bpdiagram  Provider Name: [facility]     Phone: [phone]  Zone 1: ALL CLEAR  Continue to monitor your symptoms:  . BP reading is less than 140 (top number) or less than 90 (bottom number)  . No right upper stomach pain . No headaches or seeing spots . No feeling nauseated or throwing up . No swelling in face and hands  Zone 2: CAUTION Call your doctor's office for any of the following:  . BP reading is greater than 140 (top number) or greater than 90 (bottom number)  . Stomach pain under your ribs in the middle or right side . Headaches or seeing spots . Feeling nauseated or throwing up . Swelling in face and hands  Zone 3: EMERGENCY  Seek immediate medical care if you have any of the  following:  . BP reading is greater than160 (top number) or greater than 110 (bottom number) . Severe headaches not improving with Tylenol . Serious difficulty catching your breath . Any worsening symptoms from Zone 2  Preterm Labor and Birth Information  The normal length of a pregnancy is 39-41 weeks. Preterm labor is when labor starts before 37 completed weeks of pregnancy. What are the risk factors for preterm labor? Preterm labor is more likely to occur in women who:  Have certain infections during pregnancy such as a bladder infection, sexually transmitted infection, or infection inside the uterus (chorioamnionitis).  Have a shorter-than-normal cervix.  Have gone into preterm labor  before.  Have had surgery on their cervix.  Are younger than age 47 or older than age 61.  Are African American.  Are pregnant with twins or multiple babies (multiple gestation).  Take street drugs or smoke while pregnant.  Do not gain enough weight while pregnant.  Became pregnant shortly after having been pregnant. What are the symptoms of preterm labor? Symptoms of preterm labor include:  Cramps similar to those that can happen during a menstrual period. The cramps may happen with diarrhea.  Pain in the abdomen or lower back.  Regular uterine contractions that may feel like tightening of the abdomen.  A feeling of increased pressure in the pelvis.  Increased watery or bloody mucus discharge from the vagina.  Water breaking (ruptured amniotic sac). Why is it important to recognize signs of preterm labor? It is important to recognize signs of preterm labor because babies who are born prematurely may not be fully developed. This can put them at an increased risk for:  Long-term (chronic) heart and lung problems.  Difficulty immediately after birth with regulating body systems, including blood sugar, body temperature, heart rate, and breathing rate.  Bleeding in the brain.  Cerebral palsy.  Learning difficulties.  Death. These risks are highest for babies who are born before 40 weeks of pregnancy. How is preterm labor treated? Treatment depends on the length of your pregnancy, your condition, and the health of your baby. It may involve: 1. Having a stitch (suture) placed in your cervix to prevent your cervix from opening too early (cerclage). 2. Taking or being given medicines, such as: ? Hormone medicines. These may be given early in pregnancy to help support the pregnancy. ? Medicine to stop contractions. ? Medicines to help mature the baby's lungs. These may be prescribed if the risk of delivery is high. ? Medicines to prevent your baby from developing cerebral  palsy. If the labor happens before 34 weeks of pregnancy, you may need to stay in the hospital. What should I do if I think I am in preterm labor? If you think that you are going into preterm labor, call your health care provider right away. How can I prevent preterm labor in future pregnancies? To increase your chance of having a full-term pregnancy:  Do not use any tobacco products, such as cigarettes, chewing tobacco, and e-cigarettes. If you need help quitting, ask your health care provider.  Do not use street drugs or medicines that have not been prescribed to you during your pregnancy.  Talk with your health care provider before taking any herbal supplements, even if you have been taking them regularly.  Make sure you gain a healthy amount of weight during your pregnancy.  Watch for infection. If you think that you might have an infection, get it checked right away.  Make sure to tell  your health care provider if you have gone into preterm labor before. This information is not intended to replace advice given to you by your health care provider. Make sure you discuss any questions you have with your health care provider. Document Revised: 07/18/2018 Document Reviewed: 08/17/2015 Elsevier Patient Education  [place].

## 2020-01-04 NOTE — Progress Notes (Signed)
TELEHEALTH VIRTUAL OBSTETRICS VISIT ENCOUNTER NOTE Patient name: Kristin Morgan MRN [id number]  Date of birth: Jan 09, 1996  I connected with patient on 01/04/20 at  8:50 AM EDT by MyChart video  and verified that I am speaking with the correct person using two identifiers. Pt is not currently in our office, she is at home.  The provider is in the office.    I discussed the limitations, risks, security and privacy concerns of performing an evaluation and management service by telephone and the availability of in person appointments. I also discussed with the patient that there may be a patient responsible charge related to this service. The patient expressed understanding and agreed to proceed.  Chief Complaint:   Routine Prenatal Visit  History of Present Illness:   Kristin Morgan is a 24 y.o. G28P2002 female at [redacted]w[redacted]d with an Estimated Date of Delivery: 02/27/20 being evaluated today for ongoing management of a low-risk pregnancy.  Depression screen [facility] 2/9 08/21/2019 08/04/2019 07/08/2019 06/02/2018 07/30/2016  Decreased Interest 0 0 0 0 0  Down, Depressed, Hopeless 0 0 0 0 0  PHQ - 2 Score 0 0 0 0 0  Altered sleeping 0 - 1 - -  Tired, decreased energy 0 - 1 - -  Change in appetite 0 - 0 - -  Feeling bad or failure about yourself  0 - 0 - -  Trouble concentrating 0 - 0 - -  Moving slowly or fidgety/restless 0 - 0 - -  Suicidal thoughts 0 - 0 - -  PHQ-9 Score 0 - 2 - -  Difficult doing work/chores Not difficult at all - Not difficult at all - -    Today she reports no complaints. Contractions: Not present. Vag. Bleeding: None.  Movement: Present. denies leaking of fluid. Review of Systems:   Pertinent items are noted in HPI Denies abnormal vaginal discharge w/ itching/odor/irritation, headaches, visual changes, shortness of breath, chest pain, abdominal pain, severe nausea/vomiting, or problems with urination or bowel movements unless otherwise stated above. Pertinent History Reviewed:  Reviewed  past medical,surgical, social, obstetrical and family history.  Reviewed problem list, medications and allergies. Physical Assessment:   Vitals:   01/04/20 0833  BP: 130/69  Pulse: 96  There is no height or weight on file to calculate BMI.        Physical Examination:   General:  Alert, oriented and cooperative.   Mental Status: Normal mood and affect perceived. Normal judgment and thought content.  Rest of physical exam deferred due to type of encounter  No results found for this or any previous visit (from the past 24 hour(s)).  Assessment & Plan:  1) Pregnancy G3P2002 at [redacted]w[redacted]d with an Estimated Date of Delivery: 02/27/20    Meds: No orders of the defined types were placed in this encounter.   Labs/procedures today: none  Plan:  Continue routine obstetrical care.  Has home bp cuff.  Check bp weekly, let us know if >140/90.  Next visit: prefers online    Reviewed: Preterm labor symptoms and general obstetric precautions including but not limited to vaginal bleeding, contractions, leaking of fluid and fetal movement were reviewed in detail with the patient. The patient was advised to call back or seek an in-person office evaluation/go to MAU at [facility] for any urgent or concerning symptoms. All questions were answered. Please refer to After Visit Summary for other counseling recommendations.    I provided 15 minutes of non-face-to-face time during this encounter.  Follow-up: Return in about 2 weeks (around 01/18/2020) for LROB, CNM, MyChart Video.  No orders of the defined types were placed in this encounter.  Cheral Marker CNM, [facility] 01/04/2020 8:43 AM

## 2020-01-19 ENCOUNTER — Telehealth (INDEPENDENT_AMBULATORY_CARE_PROVIDER_SITE_OTHER): Payer: Medicaid Other | Admitting: Women's Health

## 2020-01-19 ENCOUNTER — Encounter: Payer: Self-pay | Admitting: Women's Health

## 2020-01-19 VITALS — BP 126/67 | HR 97

## 2020-01-19 DIAGNOSIS — Z3480 Encounter for supervision of other normal pregnancy, unspecified trimester: Secondary | ICD-10-CM

## 2020-01-19 DIAGNOSIS — Z3A34 34 weeks gestation of pregnancy: Secondary | ICD-10-CM

## 2020-01-19 DIAGNOSIS — Z3483 Encounter for supervision of other normal pregnancy, third trimester: Secondary | ICD-10-CM

## 2020-01-19 NOTE — Progress Notes (Signed)
TELEHEALTH VIRTUAL OBSTETRICS VISIT ENCOUNTER NOTE Patient name: Kristin Morgan MRN [id number]  Date of birth: Dec 19, 1995  I connected with patient on 01/19/20 at  9:50 AM EDT by MyChart video  and verified that I am speaking with the correct person using two identifiers. Pt is not currently in our office, she is at home.  The provider is in the office.    I discussed the limitations, risks, security and privacy concerns of performing an evaluation and management service by telephone and the availability of in person appointments. I also discussed with the patient that there may be a patient responsible charge related to this service. The patient expressed understanding and agreed to proceed.  Chief Complaint:   Routine Prenatal Visit  History of Present Illness:   Kristin Morgan is a 24 y.o. G11P2002 female at [redacted]w[redacted]d with an Estimated Date of Delivery: 02/27/20 being evaluated today for ongoing management of a low-risk pregnancy.  Depression screen [facility] 2/9 08/21/2019 08/04/2019 07/08/2019 06/02/2018 07/30/2016  Decreased Interest 0 0 0 0 0  Down, Depressed, Hopeless 0 0 0 0 0  PHQ - 2 Score 0 0 0 0 0  Altered sleeping 0 - 1 - -  Tired, decreased energy 0 - 1 - -  Change in appetite 0 - 0 - -  Feeling bad or failure about yourself  0 - 0 - -  Trouble concentrating 0 - 0 - -  Moving slowly or fidgety/restless 0 - 0 - -  Suicidal thoughts 0 - 0 - -  PHQ-9 Score 0 - 2 - -  Difficult doing work/chores Not difficult at all - Not difficult at all - -    Today she reports very uncomfortable, hard to sit straight up, baby is in ribs, feels like she is losing mucous plug. Contractions: Not present. Vag. Bleeding: None.  Movement: Present. denies leaking of fluid. Review of Systems:   Pertinent items are noted in HPI Denies abnormal vaginal discharge w/ itching/odor/irritation, headaches, visual changes, shortness of breath, chest pain, abdominal pain, severe nausea/vomiting, or problems with urination or  bowel movements unless otherwise stated above. Pertinent History Reviewed:  Reviewed past medical,surgical, social, obstetrical and family history.  Reviewed problem list, medications and allergies. Physical Assessment:   Vitals:   01/19/20 0950  BP: 126/67  Pulse: 97  There is no height or weight on file to calculate BMI.        Physical Examination:   General:  Alert, oriented and cooperative.   Mental Status: Normal mood and affect perceived. Normal judgment and thought content.  Rest of physical exam deferred due to type of encounter  No results found for this or any previous visit (from the past 24 hour(s)).  Assessment & Plan:  1) Pregnancy G3P2002 at [redacted]w[redacted]d with an Estimated Date of Delivery: 02/27/20    Meds: No orders of the defined types were placed in this encounter.   Labs/procedures today: none  Plan:  Continue routine obstetrical care.  Has home bp cuff.  Check bp weekly, let us know if >140/90.  Next visit: prefers will be in person for gbs    Reviewed: Preterm labor symptoms and general obstetric precautions including but not limited to vaginal bleeding, contractions, leaking of fluid and fetal movement were reviewed in detail with the patient. The patient was advised to call back or seek an in-person office evaluation/go to MAU at [facility] for any urgent or concerning symptoms. All questions were answered. Please refer to  After Visit Summary for other counseling recommendations.    I provided 15 minutes of non-face-to-face time during this encounter.  Follow-up: Return in about 2 weeks (around 02/02/2020) for LROB, CNM, in person.  No orders of the defined types were placed in this encounter.  Cheral Marker CNM, [facility] 01/19/2020 10:44 AM

## 2020-01-19 NOTE — Patient Instructions (Signed)
Kristin Morgan, I greatly value your feedback.  If you receive a survey following your visit with [name] today, we appreciate you taking the time to fill it out.  Thanks, Joellyn Haff, CNM, WHNP-BC  [facility] ([address] [place], [place] [address]) Entrance C, located off of [address] valet parking   Go to [email or web address] to register for FREE online childbirth classes    Call the office [phone]) or go to [facility] if:  You begin to have strong, frequent contractions  Your water breaks.  Sometimes it is a big gush of fluid, sometimes it is just a trickle that keeps getting your panties wet or running down your legs  You have vaginal bleeding.  It is normal to have a small amount of spotting if your cervix was checked.   You don't feel your baby moving like normal.  If you don't, get you something to eat and drink and lay down and focus on feeling your baby move.  You should feel at least 10 movements in 2 hours.  If you don't, you should call the office or go to [facility].   Call the office [phone]) or go to [facility] hospital for these signs of pre-eclampsia:  Severe headache that does not go away with Tylenol  Visual changes- seeing spots, double, blurred vision  Pain under your right breast or upper abdomen that does not go away with Tums or heartburn medicine  Nausea and/or vomiting  Severe swelling in your hands, feet, and face    Home Blood Pressure Monitoring for Patients   Your provider has recommended that you check your blood pressure (BP) at least once a week at home. If you do not have a blood pressure cuff at home, one will be provided for you. Contact your provider if you have not received your monitor within 1 week.   Helpful Tips for Accurate Home Blood Pressure Checks  . Don't smoke, exercise, or drink caffeine 30 minutes before checking your BP . Use the restroom before checking your BP (a full bladder can  raise your pressure) . Relax in a comfortable upright chair . Feet on the ground . Left arm resting comfortably on a flat surface at the level of your heart . Legs uncrossed . Back supported . Sit quietly and don't talk . Place the cuff on your bare arm . Adjust snuggly, so that only two fingertips can fit between your skin and the top of the cuff . Check 2 readings separated by at least one minute . Keep a log of your BP readings . For a visual, please reference this diagram: http://ccnc.care/bpdiagram  Provider Name: [facility]     Phone: [phone]  Zone 1: ALL CLEAR  Continue to monitor your symptoms:  . BP reading is less than 140 (top number) or less than 90 (bottom number)  . No right upper stomach pain . No headaches or seeing spots . No feeling nauseated or throwing up . No swelling in face and hands  Zone 2: CAUTION Call your doctor's office for any of the following:  . BP reading is greater than 140 (top number) or greater than 90 (bottom number)  . Stomach pain under your ribs in the middle or right side . Headaches or seeing spots . Feeling nauseated or throwing up . Swelling in face and hands  Zone 3: EMERGENCY  Seek immediate medical care if you have any of the  following:  . BP reading is greater than160 (top number) or greater than 110 (bottom number) . Severe headaches not improving with Tylenol . Serious difficulty catching your breath . Any worsening symptoms from Zone 2  Preterm Labor and Birth Information  The normal length of a pregnancy is 39-41 weeks. Preterm labor is when labor starts before 37 completed weeks of pregnancy. What are the risk factors for preterm labor? Preterm labor is more likely to occur in women who:  Have certain infections during pregnancy such as a bladder infection, sexually transmitted infection, or infection inside the uterus (chorioamnionitis).  Have a shorter-than-normal cervix.  Have gone into preterm labor  before.  Have had surgery on their cervix.  Are younger than age 47 or older than age 61.  Are African American.  Are pregnant with twins or multiple babies (multiple gestation).  Take street drugs or smoke while pregnant.  Do not gain enough weight while pregnant.  Became pregnant shortly after having been pregnant. What are the symptoms of preterm labor? Symptoms of preterm labor include:  Cramps similar to those that can happen during a menstrual period. The cramps may happen with diarrhea.  Pain in the abdomen or lower back.  Regular uterine contractions that may feel like tightening of the abdomen.  A feeling of increased pressure in the pelvis.  Increased watery or bloody mucus discharge from the vagina.  Water breaking (ruptured amniotic sac). Why is it important to recognize signs of preterm labor? It is important to recognize signs of preterm labor because babies who are born prematurely may not be fully developed. This can put them at an increased risk for:  Long-term (chronic) heart and lung problems.  Difficulty immediately after birth with regulating body systems, including blood sugar, body temperature, heart rate, and breathing rate.  Bleeding in the brain.  Cerebral palsy.  Learning difficulties.  Death. These risks are highest for babies who are born before 40 weeks of pregnancy. How is preterm labor treated? Treatment depends on the length of your pregnancy, your condition, and the health of your baby. It may involve: 1. Having a stitch (suture) placed in your cervix to prevent your cervix from opening too early (cerclage). 2. Taking or being given medicines, such as: ? Hormone medicines. These may be given early in pregnancy to help support the pregnancy. ? Medicine to stop contractions. ? Medicines to help mature the baby's lungs. These may be prescribed if the risk of delivery is high. ? Medicines to prevent your baby from developing cerebral  palsy. If the labor happens before 34 weeks of pregnancy, you may need to stay in the hospital. What should I do if I think I am in preterm labor? If you think that you are going into preterm labor, call your health care provider right away. How can I prevent preterm labor in future pregnancies? To increase your chance of having a full-term pregnancy:  Do not use any tobacco products, such as cigarettes, chewing tobacco, and e-cigarettes. If you need help quitting, ask your health care provider.  Do not use street drugs or medicines that have not been prescribed to you during your pregnancy.  Talk with your health care provider before taking any herbal supplements, even if you have been taking them regularly.  Make sure you gain a healthy amount of weight during your pregnancy.  Watch for infection. If you think that you might have an infection, get it checked right away.  Make sure to tell  your health care provider if you have gone into preterm labor before. This information is not intended to replace advice given to you by your health care provider. Make sure you discuss any questions you have with your health care provider. Document Revised: 07/18/2018 Document Reviewed: 08/17/2015 Elsevier Patient Education  [place].

## 2020-01-24 ENCOUNTER — Encounter (HOSPITAL_COMMUNITY): Payer: Self-pay | Admitting: Family Medicine

## 2020-01-24 ENCOUNTER — Inpatient Hospital Stay (HOSPITAL_COMMUNITY)
Admission: AD | Admit: 2020-01-24 | Discharge: 2020-01-24 | Disposition: A | Discharge status: Home / Self Care | Payer: Medicaid Other | Attending: Family Medicine | Admitting: Family Medicine

## 2020-01-24 ENCOUNTER — Other Ambulatory Visit: Payer: Self-pay

## 2020-01-24 DIAGNOSIS — Z3A35 35 weeks gestation of pregnancy: Secondary | ICD-10-CM | POA: Insufficient documentation

## 2020-01-24 DIAGNOSIS — O26893 Other specified pregnancy related conditions, third trimester: Secondary | ICD-10-CM

## 2020-01-24 DIAGNOSIS — R102 Pelvic and perineal pain: Secondary | ICD-10-CM | POA: Diagnosis not present

## 2020-01-24 DIAGNOSIS — Z3689 Encounter for other specified antenatal screening: Secondary | ICD-10-CM

## 2020-01-24 DIAGNOSIS — N949 Unspecified condition associated with female genital organs and menstrual cycle: Secondary | ICD-10-CM

## 2020-01-24 DIAGNOSIS — O368130 Decreased fetal movements, third trimester, not applicable or unspecified: Secondary | ICD-10-CM | POA: Insufficient documentation

## 2020-01-24 LAB — URINALYSIS, ROUTINE W REFLEX MICROSCOPIC
Bilirubin Urine: NEGATIVE
Glucose, UA: NEGATIVE mg/dL
Hgb urine dipstick: NEGATIVE
Ketones, ur: NEGATIVE mg/dL
Leukocytes,Ua: NEGATIVE
Nitrite: NEGATIVE
Protein, ur: NEGATIVE mg/dL
Specific Gravity, Urine: 1.018 (ref 1.005–1.030)
pH: 6 (ref 5.0–8.0)

## 2020-01-24 LAB — COMPREHENSIVE METABOLIC PANEL
ALT: 20 U/L (ref 0–44)
AST: 17 U/L (ref 15–41)
Albumin: 2.7 g/dL — ABNORMAL LOW (ref 3.5–5.0)
Alkaline Phosphatase: 103 U/L (ref 38–126)
Anion gap: 12 (ref 5–15)
BUN: 7 mg/dL (ref 6–20)
CO2: 20 mmol/L — ABNORMAL LOW (ref 22–32)
Calcium: 8.5 mg/dL — ABNORMAL LOW (ref 8.9–10.3)
Chloride: 104 mmol/L (ref 98–111)
Creatinine, Ser: 0.52 mg/dL (ref 0.44–1.00)
GFR, Estimated: >60 mL/min (ref >60)
Glucose, Bld: 100 mg/dL — ABNORMAL HIGH (ref 70–99)
Potassium: 3.9 mmol/L (ref 3.5–5.1)
Sodium: 136 mmol/L (ref 135–145)
Total Bilirubin: 0.4 mg/dL (ref 0.3–1.2)
Total Protein: 6.7 g/dL (ref 6.5–8.1)

## 2020-01-24 LAB — CBC
HCT: 34.5 % — ABNORMAL LOW (ref 36.0–46.0)
Hemoglobin: 10.4 g/dL — ABNORMAL LOW (ref 12.0–15.0)
MCH: 21.5 pg — ABNORMAL LOW (ref 26.0–34.0)
MCHC: 30.1 g/dL (ref 30.0–36.0)
MCV: 71.4 fL — ABNORMAL LOW (ref 80.0–100.0)
Platelets: 284 10*3/uL (ref 150–400)
RBC: 4.83 MIL/uL (ref 3.87–5.11)
RDW: 15.9 % — ABNORMAL HIGH (ref 11.5–15.5)
WBC: 13.6 10*3/uL — ABNORMAL HIGH (ref 4.0–10.5)
nRBC: 0 % (ref 0.0–0.2)

## 2020-01-24 LAB — PROTEIN / CREATININE RATIO, URINE
Creatinine, Urine: 118.32 mg/dL
Protein Creatinine Ratio: 0.15 mg/mg{Cre} (ref 0.00–0.15)
Total Protein, Urine: 18 mg/dL

## 2020-01-24 NOTE — Discharge Instructions (Signed)
Signs and Symptoms of Labor Labor is your body's natural process of moving your baby, placenta, and umbilical cord out of your uterus. The process of labor usually starts when your baby is full-term, between 37 and 40 weeks of pregnancy. How will I know when I am close to going into labor? As your body prepares for labor and the birth of your baby, you may notice the following symptoms in the weeks and days before true labor starts:  Having a strong desire to get your home ready to receive your new baby. This is called nesting. Nesting may be a sign that labor is approaching, and it may occur several weeks before birth. Nesting may involve cleaning and organizing your home.  Passing a small amount of thick, bloody mucus out of your vagina (normal bloody show or losing your mucus plug). This may happen more than a week before labor begins, or it might occur right before labor begins as the opening of the cervix starts to widen (dilate). For some women, the entire mucus plug passes at once. For others, smaller portions of the mucus plug may gradually pass over several days.  Your baby moving (dropping) lower in your pelvis to get into position for birth (lightening). When this happens, you may feel more pressure on your bladder and pelvic bone and less pressure on your ribs. This may make it easier to breathe. It may also cause you to need to urinate more often and have problems with bowel movements.  Having "practice contractions" (Braxton Hicks contractions) that occur at irregular (unevenly spaced) intervals that are more than 10 minutes apart. This is also called false labor. False labor contractions are common after exercise or sexual activity, and they will stop if you change position, rest, or drink fluids. These contractions are usually mild and do not get stronger over time. They may feel like: ? A backache or back pain. ? Mild cramps, similar to menstrual cramps. ? Tightening or pressure in  your abdomen. Other early symptoms that labor may be starting soon include:  Nausea or loss of appetite.  Diarrhea.  Having a sudden burst of energy, or feeling very tired.  Mood changes.  Having trouble sleeping. How will I know when labor has begun? Signs that true labor has begun may include:  Having contractions that come at regular (evenly spaced) intervals and increase in intensity. This may feel like more intense tightening or pressure in your abdomen that moves to your back. ? Contractions may also feel like rhythmic pain in your upper thighs or back that comes and goes at regular intervals. ? For first-time mothers, this change in intensity of contractions often occurs at a more gradual pace. ? Women who have given birth before may notice a more rapid progression of contraction changes.  Having a feeling of pressure in the vaginal area.  Your water breaking (rupture of membranes). This is when the sac of fluid that surrounds your baby breaks. When this happens, you will notice fluid leaking from your vagina. This may be clear or blood-tinged. Labor usually starts within 24 hours of your water breaking, but it may take longer to begin. ? Some women notice this as a gush of fluid. ? Others notice that their underwear repeatedly becomes damp. Follow these instructions at home:   When labor starts, or if your water breaks, call your health care provider or nurse care line. Based on your situation, they will determine when you should go in for an   exam.  When you are in early labor, you may be able to rest and manage symptoms at home. Some strategies to try at home include: ? Breathing and relaxation techniques. ? Taking a warm bath or shower. ? Listening to music. ? Using a heating pad on the lower back for pain. If you are directed to use heat:  Place a towel between your skin and the heat source.  Leave the heat on for 20-30 minutes.  Remove the heat if your skin turns  bright red. This is especially important if you are unable to feel pain, heat, or cold. You may have a greater risk of getting burned. Get help right away if:  You have painful, regular contractions that are 5 minutes apart or less.  Labor starts before you are [redacted] weeks along in your pregnancy.  You have a fever.  You have a headache that does not go away.  You have bright red blood coming from your vagina.  You do not feel your baby moving.  You have a sudden onset of: ? Severe headache with vision problems. ? Nausea, vomiting, or diarrhea. ? Chest pain or shortness of breath. These symptoms may be an emergency. If your health care provider recommends that you go to the hospital or birth center where you plan to deliver, do not drive yourself. Have someone else drive you, or call emergency services (911 in the [place] Summary  Labor is your body's natural process of moving your baby, placenta, and umbilical cord out of your uterus.  The process of labor usually starts when your baby is full-term, between 37 and 40 weeks of pregnancy.  When labor starts, or if your water breaks, call your health care provider or nurse care line. Based on your situation, they will determine when you should go in for an exam. This information is not intended to replace advice given to you by your health care provider. Make sure you discuss any questions you have with your health care provider. Document Revised: 12/24/2016 Document Reviewed: 08/31/2016 Elsevier Patient Education  2020 [employer]  Round Ligament Pain during Pregnancy Many women will experience a type of pain referred to as "round ligament pain" during their pregnancy. This is associated with abdominal pain or discomfort. Since any type of abdominal pain during pregnancy can be disconcerting, it is important to talk about round ligament pain to relieve any anxiety or fears you may have regarding the symptoms you are feeling.  Round ligament pain is due to normal changes that take place in the body during pregnancy. It is caused by stretching of the round ligaments attached to the uterus. More commonly it occurs on the right side of the pelvis. Round Ligament: An Overview Typically in the non-pregnant state the uterus is about the size of an apple or pear. There are thick ligaments which hold the uterus in place in the abdomen, referred to as round ligaments. During pregnancy, your uterus will expand in size and weight, and the ligaments supporting it will have to stretch, becoming longer and thinner. As these ligaments pull and tug they may irritate nearby nerve fibers, which causes pain. The severity of the pain in some cases can seem extreme. Some common symptoms of round ligament pain include: . Ligament spasms or contractions/cramps that trigger a sharp pain typically on the right side of the abdomen. . Pain upon waking or suddenly rolling over in your sleep. . Pain in the abdomen that is sharp brought on by   exercise or other vigorous activity. Similar Problems Round ligament pain is often mistaken for other medical conditions because the symptoms are similar. Acute abdominal pain during pregnancy may also be a sign of other conditions including:  Abdominal cramps - Some abdominal pain is simply caused by change in bowel habits associated with pregnancy. Gas is a common problem that can cause sharp, shooting pain. You should always seek out medical care if your pain is accompanied by fever, chills, pain upon urination or if you have difficulty walking. Further exams and tests will be conducted to ensure that you do not have a more serious condition. It is not uncommon for women with lower abdominal pain to have a urinary tract infection, thus you may also be asked for a urine sample. Treatment If all other conditions are ruled out you can treat your round ligament pain relatively easily. You may be  advised to take some acetaminophen (Tylenol) to reduce the severity of any persistent pain and asked to reduce your activity level. You can apply a heating pad to the area of pain or take a warm bath. Lying on the opposite side of the pain may help as well. Most women will find relief from round ligament pain simply by altering their daily routines slightly. The good news is round ligament pain will disappear completely once you have given birth to your child!

## 2020-01-24 NOTE — MAU Note (Addendum)
Kristin Morgan is a 24 y.o. at [redacted]w[redacted]d here in MAU reporting: having increased pelvic pressure and abdominal pain over the past couple of days. States she has not felt much fetal movement today. No vaginal bleeding or LOF.  Onset of complaint: today  Pain score: abdominal pain 7/10, pelvic 7/10  Vitals:   01/24/20 1831  BP: (!) 142/75  Pulse: 97  Resp: 20  Temp: 98.3 F (36.8 C)  SpO2: 97%     FHT: 150  Lab orders placed from triage: UA, unable to give a sample at this time

## 2020-01-24 NOTE — MAU Provider Note (Signed)
History     CSN: [id number]  Arrival date and time: 01/24/20 1819   First Provider Initiated Contact with Patient 01/24/20 1956      Chief Complaint  Patient presents with   Pelvic Pain   Abdominal Pain   Decreased Fetal Movement   HPI Kristin Morgan is a 24 y.o. G3P2002 at [redacted]w[redacted]d who presents to MAU with chief complaints of decreased fetal movement, pelvic and vaginal pain.  Decreased fetal movement This is a new problem, onset today. Patient states her baby typically moves all day long. She is unaware of interventions to trigger fetal movement. She endorses movement on arrival to MAU and throughout her period of evaluation.  Pelvic and abdominal pain This is a recurrent problem, onset about one week ago and worsening in the past 2-3 days. She identifies her vagina as the origin of her pain and states her pain radiates straight down. Her pain is aggravated by prolonged walking, standing and repositioning, relieved by rest. Her pain score on CNM assessment is 0/10. She has not taken medication or tried other treatments.  She receives care with [facility].  OB History    Gravida  3   Para  2   Term  2   Preterm      AB      Living  2     SAB      TAB      Ectopic      Multiple      Live Births  2           Past Medical History:  Diagnosis Date   Depression    Pregnant     Past Surgical History:  Procedure Laterality Date   WISDOM TOOTH EXTRACTION  12/2018    Family History  Problem Relation Age of Onset   Hypertension Mother    Mental illness Mother    Hypertension Maternal Grandmother     Social History   Tobacco Use   Smoking status: Never Smoker   Smokeless tobacco: Never Used  Vaping Use   Vaping Use: Never used  Substance Use Topics   Alcohol use: Not Currently   Drug use: No    Allergies:  Allergies  Allergen Reactions   Aspirin Nausea And Vomiting   Sulfa Antibiotics Hives    Medications Prior to  Admission  Medication Sig Dispense Refill Last Dose   Blood Pressure Monitor MISC For regular home bp monitoring during pregnancy 1 each 0    Doxylamine-Pyridoxine (DICLEGIS) 10-10 MG TBEC Take 2 qhs; may also take one in am and one in afternoon prn nausea 120 tablet 6    ferrous sulfate 325 (65 FE) MG tablet Take 1 tablet (325 mg total) by mouth 2 (two) times daily with a meal. 60 tablet 3    Prenatal Vit-Fe Fumarate-FA (PRENATAL VITAMIN PO) Take by mouth daily.       Review of Systems  Genitourinary: Positive for pelvic pain and vaginal pain. Negative for vaginal bleeding and vaginal discharge.  All other systems reviewed and are negative.  Physical Exam   Blood pressure 123/77, pulse 96, temperature 98.3 F (36.8 C), temperature source Oral, resp. rate 20, height 5\' 1"  (1.549 m), weight 106.1 kg, last menstrual period 05/16/2019, SpO2 97 %.  Physical Exam Vitals and nursing note reviewed. Exam conducted with a chaperone present.  Constitutional:      Appearance: She is well-developed.  Cardiovascular:     Rate and Rhythm: Normal rate.  Heart sounds: Normal heart sounds.  Pulmonary:     Effort: Pulmonary effort is normal.     Breath sounds: Normal breath sounds.  Abdominal:     Palpations: Abdomen is soft.     Tenderness: There is no abdominal tenderness. There is no right CVA tenderness or left CVA tenderness.     Comments: Gravid  Skin:    General: Skin is warm and dry.     Capillary Refill: Capillary refill takes less than 2 seconds.     MAU Course/MDM  Procedures  --Reactive tracing: baseline 135, mod var, + 15 x 15 accels, no decels --Toco: rare contraction, not felt by patient --Cervix FT/thick/posterior --Patient pushed NST button 19 times in 15 minutes --FFN not indicated, [redacted] weeks GA --Elevated BP x 1, PEC labs ordered by CNM on previous shift, normal results  Patient Vitals for the past 24 hrs:  BP Temp Temp src Pulse Resp SpO2 Height Weight   01/24/20 2015 123/77 -- -- 96 -- 97 % -- --  01/24/20 2005 -- -- -- -- -- 97 % -- --  01/24/20 2001 136/78 -- -- 90 -- -- -- --  01/24/20 1946 127/72 -- -- 93 -- -- -- --  01/24/20 1931 129/80 -- -- 94 -- -- -- --  01/24/20 1831 (!) 142/75 98.3 F (36.8 C) Oral 97 20 97 % -- --  01/24/20 1826 -- -- -- -- -- -- 5\' 1"  (1.549 m) 106.1 kg   Results for orders placed or performed during the hospital encounter of 01/24/20 (from the past 24 hour(s))  Urinalysis, Routine w reflex microscopic Urine, Clean Catch     Status: Abnormal   Collection Time: 01/24/20  7:31 PM  Result Value Ref Range   Color, Urine YELLOW YELLOW   APPearance HAZY (A) CLEAR   Specific Gravity, Urine 1.018 1.005 - 1.030   pH 6.0 5.0 - 8.0   Glucose, UA NEGATIVE NEGATIVE mg/dL   Hgb urine dipstick NEGATIVE NEGATIVE   Bilirubin Urine NEGATIVE NEGATIVE   Ketones, ur NEGATIVE NEGATIVE mg/dL   Protein, ur NEGATIVE NEGATIVE mg/dL   Nitrite NEGATIVE NEGATIVE   Leukocytes,Ua NEGATIVE NEGATIVE  Protein / creatinine ratio, urine     Status: None   Collection Time: 01/24/20  7:31 PM  Result Value Ref Range   Creatinine, Urine 118.32 mg/dL   Total Protein, Urine 18 mg/dL   Protein Creatinine Ratio 0.15 0.00 - 0.15 mg/mg[Cre]  CBC     Status: Abnormal   Collection Time: 01/24/20  7:35 PM  Result Value Ref Range   WBC 13.6 (H) 4.0 - 10.5 K/uL   RBC 4.83 3.87 - 5.11 MIL/uL   Hemoglobin 10.4 (L) 12.0 - 15.0 g/dL   HCT 01/26/20 (L) 36 - 46 %   MCV 71.4 (L) 80.0 - 100.0 fL   MCH 21.5 (L) 26.0 - 34.0 pg   MCHC 30.1 30.0 - 36.0 g/dL   RDW 02.6 (H) 37.8 - 58.8 %   Platelets 284 150 - 400 K/uL   nRBC 0.0 0.0 - 0.2 %  Comprehensive metabolic panel     Status: Abnormal   Collection Time: 01/24/20  7:35 PM  Result Value Ref Range   Sodium 136 135 - 145 mmol/L   Potassium 3.9 3.5 - 5.1 mmol/L   Chloride 104 98 - 111 mmol/L   CO2 20 (L) 22 - 32 mmol/L   Glucose, Bld 100 (H) 70 - 99 mg/dL   BUN 7 6 - 20 mg/dL  Creatinine, Ser  0.52 0.44 - 1.00 mg/dL   Calcium 8.5 (L) 8.9 - 10.3 mg/dL   Total Protein 6.7 6.5 - 8.1 g/dL   Albumin 2.7 (L) 3.5 - 5.0 g/dL   AST 17 15 - 41 U/L   ALT 20 0 - 44 U/L   Alkaline Phosphatase 103 38 - 126 U/L   Total Bilirubin 0.4 0.3 - 1.2 mg/dL   GFR, Estimated >34 >28 mL/min   Anion gap 12 5 - 15   Assessment and Plan  --24 y.o. G3P2002 at [redacted]w[redacted]d  --Reactive tracing --Round ligament and pubic symphysis pain --Patient denies pain throughout evaluation in MAU --Elevated BP x 1, normal PEC labs --Discharge home in stable condition  Calvert Cantor, CNM 01/24/2020, 9:35 PM

## 2020-01-24 NOTE — MAU Note (Signed)
Patient c/o pain and pressure in vaginal area x 3 days. Today c/o decreased FM x 2+ hours. Patient attempted to improve fetal movement by lying down and increasing po intake. Patient denies VB, LOF.

## 2020-01-28 DIAGNOSIS — Z3400 Encounter for supervision of normal first pregnancy, unspecified trimester: Secondary | ICD-10-CM | POA: Diagnosis not present

## 2020-02-03 ENCOUNTER — Ambulatory Visit (INDEPENDENT_AMBULATORY_CARE_PROVIDER_SITE_OTHER): Payer: Medicaid Other | Admitting: Advanced Practice Midwife

## 2020-02-03 ENCOUNTER — Other Ambulatory Visit (HOSPITAL_COMMUNITY)
Admission: RE | Admit: 2020-02-03 | Discharge: 2020-02-03 | Disposition: A | Discharge status: Home / Self Care | Payer: Medicaid Other | Source: Ambulatory Visit | Attending: Advanced Practice Midwife | Admitting: Advanced Practice Midwife

## 2020-02-03 ENCOUNTER — Encounter: Payer: Self-pay | Admitting: Advanced Practice Midwife

## 2020-02-03 ENCOUNTER — Other Ambulatory Visit: Payer: Self-pay

## 2020-02-03 VITALS — BP 125/75 | HR 120 | Wt 234.4 lb

## 2020-02-03 DIAGNOSIS — Z3480 Encounter for supervision of other normal pregnancy, unspecified trimester: Secondary | ICD-10-CM

## 2020-02-03 DIAGNOSIS — Z331 Pregnant state, incidental: Secondary | ICD-10-CM

## 2020-02-03 DIAGNOSIS — Z1389 Encounter for screening for other disorder: Secondary | ICD-10-CM

## 2020-02-03 DIAGNOSIS — Z3A36 36 weeks gestation of pregnancy: Secondary | ICD-10-CM

## 2020-02-03 LAB — POCT URINALYSIS DIPSTICK OB
Blood, UA: NEGATIVE
Glucose, UA: NEGATIVE
Ketones, UA: NEGATIVE
Leukocytes, UA: NEGATIVE
Nitrite, UA: NEGATIVE

## 2020-02-03 NOTE — Patient Instructions (Signed)
Braxton Hicks Contractions °Contractions of the uterus can occur throughout pregnancy, but they are not always a sign that you are in labor. You may have practice contractions called Braxton Hicks contractions. These false labor contractions are sometimes confused with true labor. °What are Braxton Hicks contractions? °Braxton Hicks contractions are tightening movements that occur in the muscles of the uterus before labor. Unlike true labor contractions, these contractions do not result in opening (dilation) and thinning of the cervix. Toward the end of pregnancy (32-34 weeks), Braxton Hicks contractions can happen more often and may become stronger. These contractions are sometimes difficult to tell apart from true labor because they can be very uncomfortable. You should not feel embarrassed if you go to the hospital with false labor. °Sometimes, the only way to tell if you are in true labor is for your health care provider to look for changes in the cervix. The health care provider will do a physical exam and may monitor your contractions. If you are not in true labor, the exam should show that your cervix is not dilating and your water has not broken. °If there are no other health problems associated with your pregnancy, it is completely safe for you to be sent home with false labor. You may continue to have Braxton Hicks contractions until you go into true labor. °How to tell the difference between true labor and false labor °True labor °· Contractions last 30-70 seconds. °· Contractions become very regular. °· Discomfort is usually felt in the top of the uterus, and it spreads to the lower abdomen and low back. °· Contractions do not go away with walking. °· Contractions usually become more intense and increase in frequency. °· The cervix dilates and gets thinner. °False labor °· Contractions are usually shorter and not as strong as true labor contractions. °· Contractions are usually irregular. °· Contractions  are often felt in the front of the lower abdomen and in the groin. °· Contractions may go away when you walk around or change positions while lying down. °· Contractions get weaker and are shorter-lasting as time goes on. °· The cervix usually does not dilate or become thin. °Follow these instructions at home: ° °· Take over-the-counter and prescription medicines only as told by your health care provider. °· Keep up with your usual exercises and follow other instructions from your health care provider. °· Eat and drink lightly if you think you are going into labor. °· If Braxton Hicks contractions are making you uncomfortable: °? Change your position from lying down or resting to walking, or change from walking to resting. °? Sit and rest in a tub of warm water. °? Drink enough fluid to keep your urine pale yellow. Dehydration may cause these contractions. °? Do slow and deep breathing several times an hour. °· Keep all follow-up prenatal visits as told by your health care provider. This is important. °Contact a health care provider if: °· You have a fever. °· You have continuous pain in your abdomen. °Get help right away if: °· Your contractions become stronger, more regular, and closer together. °· You have fluid leaking or gushing from your vagina. °· You pass blood-tinged mucus (bloody show). °· You have bleeding from your vagina. °· You have low back pain that you never had before. °· You feel your baby’s head pushing down and causing pelvic pressure. °· Your baby is not moving inside you as much as it used to. °Summary °· Contractions that occur before labor are   called Braxton Hicks contractions, false labor, or practice contractions. °· Braxton Hicks contractions are usually shorter, weaker, farther apart, and less regular than true labor contractions. True labor contractions usually become progressively stronger and regular, and they become more frequent. °· Manage discomfort from Braxton Hicks contractions  by changing position, resting in a warm bath, drinking plenty of water, or practicing deep breathing. °This information is not intended to replace advice given to you by your health care provider. Make sure you discuss any questions you have with your health care provider. °Document Revised: 03/08/2017 Document Reviewed: 08/09/2016 °Elsevier Patient Education © 2020 [employer] ° °

## 2020-02-03 NOTE — Progress Notes (Signed)
   LOW-RISK PREGNANCY VISIT Patient name: Kristin Morgan MRN [id number]  Date of birth: 06/16/1995 Chief Complaint:   Routine Prenatal Visit (gbs-gc-chl/ hurting)  History of Present Illness:   Kristin Morgan is a 24 y.o. G56P2002 female at [redacted]w[redacted]d with an Estimated Date of Delivery: 02/27/20 being seen today for ongoing management of a low-risk pregnancy.  Today she reports having a lot of discomforts of late preg, esp low abd and low back; requesting elective IOL. Contractions: Irregular.  .  Movement: Present. denies leaking of fluid. Review of Systems:   Pertinent items are noted in HPI Denies abnormal vaginal discharge w/ itching/odor/irritation, headaches, visual changes, shortness of breath, chest pain, abdominal pain, severe nausea/vomiting, or problems with urination or bowel movements unless otherwise stated above. Pertinent History Reviewed:  Reviewed past medical,surgical, social, obstetrical and family history.  Reviewed problem list, medications and allergies. Physical Assessment:   Vitals:   02/03/20 1105  BP: 125/75  Pulse: (!) 120  Weight: 234 lb 6.4 oz (106.3 kg)  Body mass index is 44.29 kg/m.        Physical Examination:   General appearance: Well appearing, and in no distress  Mental status: Alert, oriented to person, place, and time  Skin: Warm & dry  Cardiovascular: Normal heart rate noted  Respiratory: Normal respiratory effort, no distress  Abdomen: Soft, gravid, nontender  Pelvic: Cervical exam performed  Dilation: 1 Effacement (%): Thick Station: -3  Extremities: Edema: None  Fetal Status: Fetal Heart Rate (bpm): 152 Fundal Height: 36 cm Movement: Present Presentation: Vertex  Results for orders placed or performed in visit on 02/03/20 (from the past 24 hour(s))  POC Urinalysis Dipstick OB   Collection Time: 02/03/20 11:13 AM  Result Value Ref Range   Color, UA     Clarity, UA     Glucose, UA Negative Negative   Bilirubin, UA     Ketones, UA neg    Spec  Grav, UA     Blood, UA neg    pH, UA     POC,PROTEIN,UA Trace Negative, Trace, Small (1+), Moderate (2+), Large (3+), 4+   Urobilinogen, UA     Nitrite, UA neg    Leukocytes, UA Negative Negative   Appearance     Odor      Assessment & Plan:  1) Low-risk pregnancy G3P2002 at [redacted]w[redacted]d with an Estimated Date of Delivery: 02/27/20   2) Discomforts of late preg, will consider 39wk elective IOL   Meds: No orders of the defined types were placed in this encounter.  Labs/procedures today: GBS/GC/chlam  Plan:  Continue routine obstetrical care   Reviewed: Term labor symptoms and general obstetric precautions including but not limited to vaginal bleeding, contractions, leaking of fluid and fetal movement were reviewed in detail with the patient.  All questions were answered. Has home bp cuff. Check bp weekly, let us know if >140/90.   Follow-up: Return in about 1 week (around 02/10/2020) for LROB.  Orders Placed This Encounter  Procedures  . Strep Gp B NAA  . POC Urinalysis Dipstick OB   Kristin Morgan [facility] 02/03/2020 11:42 AM

## 2020-02-04 LAB — CERVICOVAGINAL ANCILLARY ONLY
Chlamydia: NEGATIVE
Comment: NEGATIVE
Comment: NORMAL
Neisseria Gonorrhea: NEGATIVE

## 2020-02-05 LAB — STREP GP B NAA: Strep Gp B NAA: NEGATIVE

## 2020-02-09 ENCOUNTER — Inpatient Hospital Stay (HOSPITAL_COMMUNITY)
Admission: AD | Admit: 2020-02-09 | Discharge: 2020-02-09 | Disposition: A | Discharge status: Home / Self Care | Payer: Medicaid Other | Attending: Obstetrics and Gynecology | Admitting: Obstetrics and Gynecology

## 2020-02-09 ENCOUNTER — Encounter (HOSPITAL_COMMUNITY): Payer: Self-pay | Admitting: Obstetrics and Gynecology

## 2020-02-09 ENCOUNTER — Other Ambulatory Visit: Payer: Self-pay

## 2020-02-09 DIAGNOSIS — O471 False labor at or after 37 completed weeks of gestation: Secondary | ICD-10-CM | POA: Diagnosis not present

## 2020-02-09 DIAGNOSIS — Z3689 Encounter for other specified antenatal screening: Secondary | ICD-10-CM

## 2020-02-09 DIAGNOSIS — Z3480 Encounter for supervision of other normal pregnancy, unspecified trimester: Secondary | ICD-10-CM

## 2020-02-09 DIAGNOSIS — Z3A37 37 weeks gestation of pregnancy: Secondary | ICD-10-CM | POA: Diagnosis not present

## 2020-02-09 NOTE — MAU Note (Signed)
Pt c/o CTX beginning 1.5 hrs ago.  Denies vb or discharge, endorses + FM.  Pt denies n/v, no LOF.

## 2020-02-09 NOTE — Discharge Instructions (Signed)
Fetal Movement Counts Patient Name: ________________________________________________ Patient Due Date: ____________________ What is a fetal movement count?  A fetal movement count is the number of times that you feel your baby move during a certain amount of time. This may also be called a fetal kick count. A fetal movement count is recommended for every pregnant woman. You may be asked to start counting fetal movements as early as week 28 of your pregnancy. Pay attention to when your baby is most active. You may notice your baby's sleep and wake cycles. You may also notice things that make your baby move more. You should do a fetal movement count:  When your baby is normally most active.  At the same time each day. A good time to count movements is while you are resting, after having something to eat and drink. How do I count fetal movements? 1. Find a quiet, comfortable area. Sit, or lie down on your side. 2. Write down the date, the start time and stop time, and the number of movements that you felt between those two times. Take this information with you to your health care visits. 3. Write down your start time when you feel the first movement. 4. Count kicks, flutters, swishes, rolls, and jabs. You should feel at least 10 movements. 5. You may stop counting after you have felt 10 movements, or if you have been counting for 2 hours. Write down the stop time. 6. If you do not feel 10 movements in 2 hours, contact your health care provider for further instructions. Your health care provider may want to do additional tests to assess your baby's well-being. Contact a health care provider if:  You feel fewer than 10 movements in 2 hours.  Your baby is not moving like he or she usually does. Date: ____________ Start time: ____________ Stop time: ____________ Movements: ____________ Date: ____________ Start time: ____________ Stop time: ____________ Movements: ____________ Date: ____________  Start time: ____________ Stop time: ____________ Movements: ____________ Date: ____________ Start time: ____________ Stop time: ____________ Movements: ____________ Date: ____________ Start time: ____________ Stop time: ____________ Movements: ____________ Date: ____________ Start time: ____________ Stop time: ____________ Movements: ____________ Date: ____________ Start time: ____________ Stop time: ____________ Movements: ____________ Date: ____________ Start time: ____________ Stop time: ____________ Movements: ____________ Date: ____________ Start time: ____________ Stop time: ____________ Movements: ____________ This information is not intended to replace advice given to you by your health care provider. Make sure you discuss any questions you have with your health care provider. Document Revised: 11/13/2018 Document Reviewed: 11/13/2018 Elsevier Patient Education  2020 Else[employer]. Braxton Hicks Contractions Contractions of the uterus can occur throughout pregnancy, but they are not always a sign that you are in labor. You may have practice contractions called Braxton Hicks contractions. These false labor contractions are sometimes confused with true labor. What are Braxton Hicks contractions? Braxton Hicks contractions are tightening movements that occur in the muscles of the uterus before labor. Unlike true labor contractions, these contractions do not result in opening (dilation) and thinning of the cervix. Toward the end of pregnancy (32-34 weeks), Braxton Hicks contractions can happen more often and may become stronger. These contractions are sometimes difficult to tell apart from true labor because they can be very uncomfortable. You should not feel embarrassed if you go to the hospital with false labor. Sometimes, the only way to tell if you are in true labor is for your health care provider to look for changes in the cervix. The health care provider   will do a physical exam and may  monitor your contractions. If you are not in true labor, the exam should show that your cervix is not dilating and your water has not broken. If there are no other health problems associated with your pregnancy, it is completely safe for you to be sent home with false labor. You may continue to have Braxton Hicks contractions until you go into true labor. How to tell the difference between true labor and false labor True labor  Contractions last 30-70 seconds.  Contractions become very regular.  Discomfort is usually felt in the top of the uterus, and it spreads to the lower abdomen and low back.  Contractions do not go away with walking.  Contractions usually become more intense and increase in frequency.  The cervix dilates and gets thinner. False labor  Contractions are usually shorter and not as strong as true labor contractions.  Contractions are usually irregular.  Contractions are often felt in the front of the lower abdomen and in the groin.  Contractions may go away when you walk around or change positions while lying down.  Contractions get weaker and are shorter-lasting as time goes on.  The cervix usually does not dilate or become thin. Follow these instructions at home:   Take over-the-counter and prescription medicines only as told by your health care provider.  Keep up with your usual exercises and follow other instructions from your health care provider.  Eat and drink lightly if you think you are going into labor.  If Braxton Hicks contractions are making you uncomfortable: ? Change your position from lying down or resting to walking, or change from walking to resting. ? Sit and rest in a tub of warm water. ? Drink enough fluid to keep your urine pale yellow. Dehydration may cause these contractions. ? Do slow and deep breathing several times an hour.  Keep all follow-up prenatal visits as told by your health care provider. This is important. Contact a  health care provider if:  You have a fever.  You have continuous pain in your abdomen. Get help right away if:  Your contractions become stronger, more regular, and closer together.  You have fluid leaking or gushing from your vagina.  You pass blood-tinged mucus (bloody show).  You have bleeding from your vagina.  You have low back pain that you never had before.  You feel your baby's head pushing down and causing pelvic pressure.  Your baby is not moving inside you as much as it used to. Summary  Contractions that occur before labor are called Braxton Hicks contractions, false labor, or practice contractions.  Braxton Hicks contractions are usually shorter, weaker, farther apart, and less regular than true labor contractions. True labor contractions usually become progressively stronger and regular, and they become more frequent.  Manage discomfort from [facility] contractions by changing position, resting in a warm bath, drinking plenty of water, or practicing deep breathing. This information is not intended to replace advice given to you by your health care provider. Make sure you discuss any questions you have with your health care provider. Document Revised: 03/08/2017 Document Reviewed: 08/09/2016 Elsevier Patient Education  2020 [employer]. Levonorgestrel intrauterine device (IUD) What is this medicine? LEVONORGESTREL IUD (LEE voe nor jes trel) is a contraceptive (birth control) device. The device is placed inside the uterus by a healthcare professional. It is used to prevent pregnancy. This device can also be used to treat heavy bleeding that occurs during your  period. This medicine may be used for other purposes; ask your health care provider or pharmacist if you have questions. COMMON BRAND NAME(S): Cameron Ali What should I tell my health care provider before I take this medicine? They need to know if you have any of these  conditions:  abnormal Pap smear  cancer of the breast, uterus, or cervix  diabetes  endometritis  genital or pelvic infection now or in the past  have more than one sexual partner or your partner has more than one partner  heart disease  history of an ectopic or tubal pregnancy  immune system problems  IUD in place  liver disease or tumor  problems with blood clots or take blood-thinners  seizures  use intravenous drugs  uterus of unusual shape  vaginal bleeding that has not been explained  an unusual or allergic reaction to levonorgestrel, other hormones, silicone, or polyethylene, medicines, foods, dyes, or preservatives  pregnant or trying to get pregnant  breast-feeding How should I use this medicine? This device is placed inside the uterus by a health care professional. Talk to your pediatrician regarding the use of this medicine in children. Special care may be needed. Overdosage: If you think you have taken too much of this medicine contact a poison control center or emergency room at once. NOTE: This medicine is only for you. Do not share this medicine with others. What if I miss a dose? This does not apply. Depending on the brand of device you have inserted, the device will need to be replaced every 3 to 6 years if you wish to continue using this type of birth control. What may interact with this medicine? Do not take this medicine with any of the following medications:  amprenavir  bosentan  fosamprenavir This medicine may also interact with the following medications:  aprepitant  armodafinil  barbiturate medicines for inducing sleep or treating seizures  bexarotene  boceprevir  griseofulvin  medicines to treat seizures like carbamazepine, ethotoin, felbamate, oxcarbazepine, phenytoin, topiramate  modafinil  pioglitazone  rifabutin  rifampin  rifapentine  some medicines to treat HIV infection like atazanavir, efavirenz,  indinavir, lopinavir, nelfinavir, tipranavir, ritonavir  St. John's wort  warfarin This list may not describe all possible interactions. Give your health care provider a list of all the medicines, herbs, non-prescription drugs, or dietary supplements you use. Also tell them if you smoke, drink alcohol, or use illegal drugs. Some items may interact with your medicine. What should I watch for while using this medicine? Visit your doctor or health care professional for regular check ups. See your doctor if you or your partner has sexual contact with others, becomes HIV positive, or gets a sexual transmitted disease. This product does not protect you against HIV infection (AIDS) or other sexually transmitted diseases. You can check the placement of the IUD yourself by reaching up to the top of your vagina with clean fingers to feel the threads. Do not pull on the threads. It is a good habit to check placement after each menstrual period. Call your doctor right away if you feel more of the IUD than just the threads or if you cannot feel the threads at all. The IUD may come out by itself. You may become pregnant if the device comes out. If you notice that the IUD has come out use a backup birth control method like condoms and call your health care provider. Using tampons will not change the position of the IUD and  are okay to use during your period. This IUD can be safely scanned with magnetic resonance imaging (MRI) only under specific conditions. Before you have an MRI, tell your healthcare provider that you have an IUD in place, and which type of IUD you have in place. What side effects may I notice from receiving this medicine? Side effects that you should report to your doctor or health care professional as soon as possible:  allergic reactions like skin rash, itching or hives, swelling of the face, lips, or tongue  fever, flu-like symptoms  genital sores  high blood pressure  no menstrual  period for 6 weeks during use  pain, swelling, warmth in the leg  pelvic pain or tenderness  severe or sudden headache  signs of pregnancy  stomach cramping  sudden shortness of breath  trouble with balance, talking, or walking  unusual vaginal bleeding, discharge  yellowing of the eyes or skin Side effects that usually do not require medical attention (report to your doctor or health care professional if they continue or are bothersome):  acne  breast pain  change in sex drive or performance  changes in weight  cramping, dizziness, or faintness while the device is being inserted  headache  irregular menstrual bleeding within first 3 to 6 months of use  nausea This list may not describe all possible side effects. Call your doctor for medical advice about side effects. You may report side effects to FDA at 1-800-FDA-1088. Where should I keep my medicine? This does not apply. NOTE: This sheet is a summary. It may not cover all possible information. If you have questions about this medicine, talk to your doctor, pharmacist, or health care provider.  2020 Elsevier/Gold Standard (2018-02-04 13:22:01)

## 2020-02-09 NOTE — MAU Provider Note (Signed)
S: Ms. Laniqua Torrens is a 24 y.o. G3P2002 at [redacted]w[redacted]d  who presents to MAU today for labor evaluation. Nurse reports patient with no cervical change after 2 exams.  Requests discharge to home.      Cervical exam by RN:  Dilation: 1.5 Effacement (%): 60 Cervical Position: Middle Station: -2, -3 Presentation: Vertex Exam by:: Leafy Ro, RN  Fetal Monitoring: Baseline: 145 Variability: Moderate Accelerations: Present 15x15 Decelerations: None Contractions: Irregular, graphs mild range  MDM Discussed patient with RN. NST reviewed.   A: SIUP at [redacted]w[redacted]d  False labor NST Reactive  P: Discharge home Patient to follow-up with primary office as scheduled  Patient may return to MAU as needed or when in labor   Gerrit Heck, [place] 02/09/2020 4:31 PM

## 2020-02-12 ENCOUNTER — Encounter: Payer: Medicaid Other | Admitting: Advanced Practice Midwife

## 2020-02-15 ENCOUNTER — Ambulatory Visit (INDEPENDENT_AMBULATORY_CARE_PROVIDER_SITE_OTHER): Payer: Medicaid Other | Admitting: Obstetrics & Gynecology

## 2020-02-15 ENCOUNTER — Encounter: Payer: Self-pay | Admitting: Obstetrics & Gynecology

## 2020-02-15 ENCOUNTER — Telehealth: Payer: Self-pay | Admitting: Obstetrics & Gynecology

## 2020-02-15 VITALS — BP 137/87 | HR 108 | Wt 238.0 lb

## 2020-02-15 DIAGNOSIS — Z3480 Encounter for supervision of other normal pregnancy, unspecified trimester: Secondary | ICD-10-CM

## 2020-02-15 NOTE — Telephone Encounter (Signed)
Pt states bp is still really elevated and was told to call office to notify nurse if it's high. Patient was seen in office this morning.

## 2020-02-15 NOTE — Progress Notes (Signed)
   LOW-RISK PREGNANCY VISIT Patient name: Kristin Morgan MRN [id number]  Date of birth: 1996-02-05 Chief Complaint:   Routine Prenatal Visit (w/i ? rupture)  History of Present Illness:   Kristin Morgan is a 24 y.o. G87P2002 female at [redacted]w[redacted]d with an Estimated Date of Delivery: 02/27/20 being seen today for ongoing management of a low-risk pregnancy.  Depression screen [facility] 2/9 08/21/2019 08/04/2019 07/08/2019 06/02/2018 07/30/2016  Decreased Interest 0 0 0 0 0  Down, Depressed, Hopeless 0 0 0 0 0  PHQ - 2 Score 0 0 0 0 0  Altered sleeping 0 - 1 - -  Tired, decreased energy 0 - 1 - -  Change in appetite 0 - 0 - -  Feeling bad or failure about yourself  0 - 0 - -  Trouble concentrating 0 - 0 - -  Moving slowly or fidgety/restless 0 - 0 - -  Suicidal thoughts 0 - 0 - -  PHQ-9 Score 0 - 2 - -  Difficult doing work/chores Not difficult at all - Not difficult at all - -    Today she reports leakage of fluid since yesterday afternoon no gushes noted No contractions no bleeding. Contractions: Irritability. Vag. Bleeding: None.  Movement: Present. reports leaking of fluid. Review of Systems:   Pertinent items are noted in HPI Denies abnormal vaginal discharge w/ itching/odor/irritation, headaches, visual changes, shortness of breath, chest pain, abdominal pain, severe nausea/vomiting, or problems with urination or bowel movements unless otherwise stated above. Pertinent History Reviewed:  Reviewed past medical,surgical, social, obstetrical and family history.  Reviewed problem list, medications and allergies. Physical Assessment:   Vitals:   02/15/20 1022 02/15/20 1025 02/15/20 1059  BP: (!) 151/88 (!) 153/87 137/87  Pulse: (!) 108 (!) 108   Weight: 238 lb (108 kg)    Body mass index is 43.53 kg/m.        Physical Examination:   General appearance: Well appearing, and in no distress  Mental status: Alert, oriented to person, place, and time  Skin: Warm & dry  Cardiovascular: Normal heart rate  noted  Respiratory: Normal respiratory effort, no distress  Abdomen: Soft, gravid, nontender  Pelvic: Cervical exam performed  Dilation: 1.5 Effacement (%): Thick Station: -3  Extremities: Edema: None  Fetal Status: Fetal Heart Rate (bpm): 155 Fundal Height: 36 cm Movement: Present Presentation: Vertex  Chaperone: Faith Rogue   No results found for this or any previous visit (from the past 24 hour(s)).  Assessment & Plan:  1) Low-risk pregnancy G3P2002 at [redacted]w[redacted]d with an Estimated Date of Delivery: 02/27/20   2) SSE negative pool, negative fern negative nitrazine,     3)  Borderline BP >150 then 138/87 has appt in 2 days to track her BP, pt understands she may need IOL if BP is up again  Meds: No orders of the defined types were placed in this encounter.  Labs/procedures today:   Plan:  Continue routine obstetrical care  Next visit: prefers in person    Reviewed: Term labor symptoms and general obstetric precautions including but not limited to vaginal bleeding, contractions, leaking of fluid and fetal movement were reviewed in detail with the patient.  All questions were answered. Has home bp cuff. Rx faxed to . Check bp weekly, let us know if >140/90.   Follow-up: Return for keep scheduled appt.  No orders of the defined types were placed in this encounter.  Lazaro Arms 02/15/2020 11:07 AM

## 2020-02-17 ENCOUNTER — Inpatient Hospital Stay (HOSPITAL_COMMUNITY)
Admission: AD | Admit: 2020-02-17 | Discharge: 2020-02-19 | DRG: 807 | Disposition: A | Discharge status: Home / Self Care | Payer: Medicaid Other | Attending: Family Medicine | Admitting: Family Medicine

## 2020-02-17 ENCOUNTER — Ambulatory Visit (INDEPENDENT_AMBULATORY_CARE_PROVIDER_SITE_OTHER): Payer: Medicaid Other | Admitting: Advanced Practice Midwife

## 2020-02-17 VITALS — BP 142/93 | HR 91 | Wt 238.0 lb

## 2020-02-17 DIAGNOSIS — Z3A38 38 weeks gestation of pregnancy: Secondary | ICD-10-CM

## 2020-02-17 DIAGNOSIS — Z30014 Encounter for initial prescription of intrauterine contraceptive device: Secondary | ICD-10-CM | POA: Diagnosis not present

## 2020-02-17 DIAGNOSIS — Z30430 Encounter for insertion of intrauterine contraceptive device: Secondary | ICD-10-CM | POA: Diagnosis not present

## 2020-02-17 DIAGNOSIS — Z20822 Contact with and (suspected) exposure to covid-19: Secondary | ICD-10-CM | POA: Diagnosis not present

## 2020-02-17 DIAGNOSIS — O99214 Obesity complicating childbirth: Secondary | ICD-10-CM | POA: Diagnosis present

## 2020-02-17 DIAGNOSIS — Z1389 Encounter for screening for other disorder: Secondary | ICD-10-CM

## 2020-02-17 DIAGNOSIS — O134 Gestational [pregnancy-induced] hypertension without significant proteinuria, complicating childbirth: Secondary | ICD-10-CM | POA: Diagnosis not present

## 2020-02-17 DIAGNOSIS — Z331 Pregnant state, incidental: Secondary | ICD-10-CM

## 2020-02-17 DIAGNOSIS — O139 Gestational [pregnancy-induced] hypertension without significant proteinuria, unspecified trimester: Secondary | ICD-10-CM | POA: Diagnosis present

## 2020-02-17 DIAGNOSIS — O133 Gestational [pregnancy-induced] hypertension without significant proteinuria, third trimester: Secondary | ICD-10-CM

## 2020-02-17 DIAGNOSIS — Z3480 Encounter for supervision of other normal pregnancy, unspecified trimester: Secondary | ICD-10-CM

## 2020-02-17 LAB — CBC
HCT: 34.8 % — ABNORMAL LOW (ref 36.0–46.0)
Hemoglobin: 10.4 g/dL — ABNORMAL LOW (ref 12.0–15.0)
MCH: 22 pg — ABNORMAL LOW (ref 26.0–34.0)
MCHC: 29.9 g/dL — ABNORMAL LOW (ref 30.0–36.0)
MCV: 73.7 fL — ABNORMAL LOW (ref 80.0–100.0)
Platelets: 337 10*3/uL (ref 150–400)
RBC: 4.72 MIL/uL (ref 3.87–5.11)
RDW: 17 % — ABNORMAL HIGH (ref 11.5–15.5)
WBC: 13.2 10*3/uL — ABNORMAL HIGH (ref 4.0–10.5)
nRBC: 0 % (ref 0.0–0.2)

## 2020-02-17 LAB — COMPREHENSIVE METABOLIC PANEL
ALT: 18 U/L (ref 0–44)
AST: 24 U/L (ref 15–41)
Albumin: 2.6 g/dL — ABNORMAL LOW (ref 3.5–5.0)
Alkaline Phosphatase: 126 U/L (ref 38–126)
Anion gap: 13 (ref 5–15)
BUN: 7 mg/dL (ref 6–20)
CO2: 18 mmol/L — ABNORMAL LOW (ref 22–32)
Calcium: 8.4 mg/dL — ABNORMAL LOW (ref 8.9–10.3)
Chloride: 105 mmol/L (ref 98–111)
Creatinine, Ser: 0.6 mg/dL (ref 0.44–1.00)
GFR, Estimated: >60 mL/min (ref >60)
Glucose, Bld: 135 mg/dL — ABNORMAL HIGH (ref 70–99)
Potassium: 3.3 mmol/L — ABNORMAL LOW (ref 3.5–5.1)
Sodium: 136 mmol/L (ref 135–145)
Total Bilirubin: 0.7 mg/dL (ref 0.3–1.2)
Total Protein: 6.3 g/dL — ABNORMAL LOW (ref 6.5–8.1)

## 2020-02-17 LAB — RESPIRATORY PANEL BY RT PCR (FLU A&B, COVID)
Influenza A by PCR: NEGATIVE
Influenza B by PCR: NEGATIVE
SARS Coronavirus 2 by RT PCR: NEGATIVE

## 2020-02-17 LAB — TYPE AND SCREEN
ABO/RH(D): A POS
Antibody Screen: NEGATIVE

## 2020-02-17 MED ORDER — FENTANYL CITRATE (PF) 100 MCG/2ML IJ SOLN: 100.0000 ug | INTRAMUSCULAR | Status: DC | PRN

## 2020-02-17 MED ORDER — PHENYLEPHRINE 40 MCG/ML (10ML) SYRINGE FOR IV PUSH (FOR BLOOD PRESSURE SUPPORT): 80.0000 ug | PREFILLED_SYRINGE | INTRAVENOUS | Status: DC | PRN

## 2020-02-17 MED ORDER — DIPHENHYDRAMINE HCL 50 MG/ML IJ SOLN
12.5000 mg | INTRAMUSCULAR | Status: DC | PRN
  Administered 2020-02-18: 12.5 mg via INTRAVENOUS
  Filled 2020-02-17: qty 1

## 2020-02-17 MED ORDER — ONDANSETRON HCL 4 MG/2ML IJ SOLN: 4.0000 mg | Freq: Four times a day (QID) | INTRAMUSCULAR | Status: DC | PRN

## 2020-02-17 MED ORDER — TERBUTALINE SULFATE 1 MG/ML IJ SOLN: 0.2500 mg | Freq: Once | INTRAMUSCULAR | Status: DC | PRN

## 2020-02-17 MED ORDER — FENTANYL-BUPIVACAINE-NACL 0.5-0.125-0.9 MG/250ML-% EP SOLN
12.0000 mL/h | EPIDURAL | Status: DC | PRN
  Filled 2020-02-17: qty 250

## 2020-02-17 MED ORDER — LACTATED RINGERS IV SOLN: INTRAVENOUS | Status: DC

## 2020-02-17 MED ORDER — OXYTOCIN BOLUS FROM INFUSION
333.0000 mL | Freq: Once | INTRAVENOUS | Status: AC
  Administered 2020-02-18: 333 mL via INTRAVENOUS

## 2020-02-17 MED ORDER — MISOPROSTOL 25 MCG QUARTER TABLET
25.0000 ug | ORAL_TABLET | ORAL | Status: DC | PRN
  Administered 2020-02-17: 25 ug via VAGINAL
  Filled 2020-02-17: qty 1

## 2020-02-17 MED ORDER — TERBUTALINE SULFATE 1 MG/ML IJ SOLN
0.2500 mg | Freq: Once | INTRAMUSCULAR | Status: AC | PRN
  Administered 2020-02-18: 0.25 mg via SUBCUTANEOUS
  Filled 2020-02-17: qty 1

## 2020-02-17 MED ORDER — LIDOCAINE HCL (PF) 1 % IJ SOLN: 30.0000 mL | INTRAMUSCULAR | Status: DC | PRN

## 2020-02-17 MED ORDER — ACETAMINOPHEN 325 MG PO TABS: 650.0000 mg | ORAL_TABLET | ORAL | Status: DC | PRN

## 2020-02-17 MED ORDER — LACTATED RINGERS IV SOLN: 500.0000 mL | INTRAVENOUS | Status: DC | PRN

## 2020-02-17 MED ORDER — OXYCODONE-ACETAMINOPHEN 5-325 MG PO TABS: 2.0000 | ORAL_TABLET | ORAL | Status: DC | PRN

## 2020-02-17 MED ORDER — LACTATED RINGERS IV SOLN
500.0000 mL | Freq: Once | INTRAVENOUS | Status: AC
  Administered 2020-02-18: 500 mL via INTRAVENOUS

## 2020-02-17 MED ORDER — EPHEDRINE 5 MG/ML INJ: 10.0000 mg | INTRAVENOUS | Status: DC | PRN

## 2020-02-17 MED ORDER — SOD CITRATE-CITRIC ACID 500-334 MG/5ML PO SOLN: 30.0000 mL | ORAL | Status: DC | PRN

## 2020-02-17 MED ORDER — OXYTOCIN-SODIUM CHLORIDE 30-0.9 UT/500ML-% IV SOLN: 2.5000 [IU]/h | INTRAVENOUS | Status: DC

## 2020-02-17 MED ORDER — OXYCODONE-ACETAMINOPHEN 5-325 MG PO TABS: 1.0000 | ORAL_TABLET | ORAL | Status: DC | PRN

## 2020-02-17 MED ORDER — OXYTOCIN-SODIUM CHLORIDE 30-0.9 UT/500ML-% IV SOLN
1.0000 m[IU]/min | INTRAVENOUS | Status: DC
  Administered 2020-02-17: 2 m[IU]/min via INTRAVENOUS
  Filled 2020-02-17: qty 500

## 2020-02-17 NOTE — Progress Notes (Signed)
   LOW-RISK PREGNANCY VISIT Patient name: Kristin Morgan MRN [id number]  Date of birth: 02-Jan-1996 Chief Complaint:   Routine Prenatal Visit  History of Present Illness:   Kristin Morgan is a 24 y.o. G23P2002 female at [redacted]w[redacted]d with an Estimated Date of Delivery: 02/27/20 being seen today for ongoing management of a low-risk pregnancy.  Today she reports having BPs at home >140/90; mild H/A that she hasn't taken Tylenol for, no visual disturbances. Contractions: Irritability. Vag. Bleeding: None.  Movement: Present. denies leaking of fluid. Review of Systems:   Pertinent items are noted in HPI Denies abnormal vaginal discharge w/ itching/odor/irritation, headaches, visual changes, shortness of breath, chest pain, abdominal pain, severe nausea/vomiting, or problems with urination or bowel movements unless otherwise stated above. Pertinent History Reviewed:  Reviewed past medical,surgical, social, obstetrical and family history.  Reviewed problem list, medications and allergies. Physical Assessment:   Vitals:   02/17/20 1422  BP: (!) 142/93  Pulse: 91  Weight: 238 lb (108 kg)  Body mass index is 43.53 kg/m.        Physical Examination:   General appearance: Well appearing, and in no distress  Mental status: Alert, oriented to person, place, and time  Skin: Warm & dry  Cardiovascular: Normal heart rate noted  Respiratory: Normal respiratory effort, no distress  Abdomen: Soft, gravid, nontender  Pelvic: Cervical exam deferred         Extremities: Edema: None  Fetal Status: Fetal Heart Rate (bpm): 135   Movement: Present    No results found for this or any previous visit (from the past 24 hour(s)).  Assessment & Plan:  1) High-risk pregnancy G3P2002 at [redacted]w[redacted]d with an Estimated Date of Delivery: 02/27/20   2) New dx of gHTN, will send to [facility] for IOL; orders put in including pre-e labs   Meds: No orders of the defined types were placed in this encounter.  Labs/procedures today: none  Plan:   Continue routine obstetrical care   Reviewed: Term labor symptoms and general obstetric precautions including but not limited to vaginal bleeding, contractions, leaking of fluid and fetal movement were reviewed in detail with the patient.  All questions were answered. Has home bp cuff. Check bp weekly, let us know if >140/90.   Follow-up: Return for 1wk nurse BP check, then 4wk PP visit.  Orders Placed This Encounter  Procedures  . POC Urinalysis Dipstick OB   Arabella Merles [facility] 02/17/2020 2:57 PM

## 2020-02-17 NOTE — Progress Notes (Signed)
LABOR PROGRESS NOTE  Kristin Morgan is a 24 y.o. G3P2002 at [redacted]w[redacted]d  admitted for IOL 2/2 gHTN.   Subjective: Endorsing intermittent discomfort with contractions.   Objective: BP 119/66   Pulse 91   Temp 97.9 F (36.6 C) (Oral)   Resp 18   LMP 05/16/2019  or  Vitals:   02/17/20 1615 02/17/20 1715 02/17/20 2028 02/17/20 2122  BP: (!) 143/84 (!) 142/84 126/74 119/66  Pulse: (!) 107 (!) 105 86 91  Resp:   18 18  Temp: 97.8 F (36.6 C)  97.9 F (36.6 C)   TempSrc: Oral  Oral    Dilation: 1.5 Effacement (%): Thick Cervical Position: Posterior Station: -3 Presentation: Vertex Exam by:: Milus Height FHT: baseline rate 150 bpm, moderate varibility, 15 x 15 acel, no decels Toco: 1.5-3 mins  Labs: Lab Results  Component Value Date   WBC 13.2 (H) 02/17/2020   HGB 10.4 (L) 02/17/2020   HCT 34.8 (L) 02/17/2020   MCV 73.7 (L) 02/17/2020   PLT 337 02/17/2020    Patient Active Problem List   Diagnosis Date Noted  . Gestational hypertension 02/17/2020  . Encounter for supervision of other normal pregnancy, unspecified trimester 08/21/2019  . Morbid obesity with BMI of 40.0-44.9, adult (HCC) 07/30/2016    Assessment / Plan: 24 y.o. G3P2002 at [redacted]w[redacted]d here for IOL 2/2 gHTN.   gHTN Recent BPs stable. PEC labs pending.  -f/u P/C  -Continue to monitor BPs   Labor: s/p cytotec x1, FB in place Fetal Wellbeing: Cat I Pain Control:  Maternally supported Anticipated MOD: Vaginal  Emmaline Wahba Autry-Lott, DO 02/17/2020, 10:08 PM PGY-2, [facility]

## 2020-02-17 NOTE — H&P (Signed)
OBSTETRIC ADMISSION HISTORY AND PHYSICAL  Kristin Morgan is a 24 y.o. female G3P2002 with IUP at [redacted]w[redacted]d by 7wk [name] presenting from the office for IOL for gHTN. This is new diagnosis for patient. No prior hx of HTN issues. She reports +FMs, No LOF, no VB, no blurry vision, headaches or peripheral edema, and RUQ pain.  She plans on breast feeding. She requests IUD for birth control but is undecided on inpatient v outpatient placement.   She received her prenatal care at [facility]   Dating: By 7wk [name] --->  Estimated Date of Delivery: 02/27/20  Sono:    @[redacted]w[redacted]d , CWD, normal anatomy, cephalic presentation, anterior placenta 252g, 35% EFW   Prenatal History/Complications:  -gHTN: diagnosed today -BMI 45  Past Medical History: Past Medical History:  Diagnosis Date  . Depression   . Pregnant     Past Surgical History: Past Surgical History:  Procedure Laterality Date  . WISDOM TOOTH EXTRACTION  12/2018    Obstetrical History: OB History    Gravida  3   Para  2   Term  2   Preterm      AB      Living  2     SAB      TAB      Ectopic      Multiple      Live Births  2           Social History Social History   Socioeconomic History  . Marital status: Single    Spouse name: 01/2019  . Number of children: 2  . Years of education: Not on file  . Highest education level: Not on file  Occupational History  . Not on file  Tobacco Use  . Smoking status: Never Smoker  . Smokeless tobacco: Never Used  Vaping Use  . Vaping Use: Never used  Substance and Sexual Activity  . Alcohol use: Not Currently  . Drug use: No  . Sexual activity: Yes    Birth control/protection: None  Other Topics Concern  . Not on file  Social History Narrative  . Not on file   Social Determinants of Health   Financial Resource Strain: Low Risk   . Difficulty of Paying Living Expenses: Not hard at all  Food Insecurity: No Food Insecurity  . Worried About Azucena Freed in  the Last Year: Never true  . Ran Out of Food in the Last Year: Never true  Transportation Needs: No Transportation Needs  . Lack of Transportation (Medical): No  . Lack of Transportation (Non-Medical): No  Physical Activity: Insufficiently Active  . Days of Exercise per Week: 2 days  . Minutes of Exercise per Session: 30 min  Stress: No Stress Concern Present  . Feeling of Stress : Not at all  Social Connections: Moderately Isolated  . Frequency of Communication with Friends and Family: More than three times a week  . Frequency of Social Gatherings with Friends and Family: More than three times a week  . Attends Religious Services: Never  . Active Member of Clubs or Organizations: No  . Attends Programme researcher, broadcasting/film/video Meetings: Never  . Marital Status: Living with partner    Family History: Family History  Problem Relation Age of Onset  . Hypertension Mother   . Mental illness Mother   . Hypertension Maternal Grandmother     Allergies: Allergies  Allergen Reactions  . Aspirin Nausea And Vomiting  . Sulfa Antibiotics Hives  Medications Prior to Admission  Medication Sig Dispense Refill Last Dose  . Blood Pressure Monitor MISC For regular home bp monitoring during pregnancy 1 each 0   . Doxylamine-Pyridoxine (DICLEGIS) 10-10 MG TBEC Take 2 qhs; may also take one in am and one in afternoon prn nausea 120 tablet 6   . ferrous sulfate 325 (65 FE) MG tablet Take 1 tablet (325 mg total) by mouth 2 (two) times daily with a meal. 60 tablet 3   . Prenatal Vit-Fe Fumarate-FA (PRENATAL VITAMIN PO) Take by mouth daily.        Review of Systems   All systems reviewed and negative except as stated in HPI  Blood pressure (!) 143/84, pulse (!) 107, temperature 97.8 F (36.6 C), temperature source Oral, last menstrual period 05/16/2019. General appearance: alert, cooperative and appears stated age Lungs: clear to auscultation bilaterally Heart: regular rate and rhythm Abdomen: soft,  non-tender; bowel sounds normal Extremities: Homans sign is negative, no sign of DVT Presentation: cephalic Fetal monitoringBaseline: 145 bpm mod variability, pos accels, no decels Uterine activity q3-4 min, not painful       Prenatal labs: ABO, Rh: --/--/PENDING (11/10 1646) Antibody: PENDING (11/10 1646) Rubella: 5.92 (05/14 1143) RPR: Non Reactive (08/20 0826)  HBsAg: Negative (05/14 1143)  HIV: Non Reactive (08/20 0826)  GBS: Negative/-- (10/27 1530)  1 hr Glucola normal Genetic screening  Normal panorama Anatomy US normal  Prenatal Transfer Tool  Maternal Diabetes: No Genetic Screening: Normal Maternal Ultrasounds/Referrals: Normal Fetal Ultrasounds or other Referrals:  None Maternal Substance Abuse:  No Significant Maternal Medications:  None Significant Maternal Lab Results: Group B Strep negative  Results for orders placed or performed during the hospital encounter of 02/17/20 (from the past 24 hour(s))  Type and screen   Collection Time: 02/17/20  4:46 PM  Result Value Ref Range   ABO/RH(D) PENDING    Antibody Screen PENDING    Sample Expiration      02/20/2020,2359 Performed at [facility], [address], [place], [place] [address]     Patient Active Problem List   Diagnosis Date Noted  . Gestational hypertension 02/17/2020  . Encounter for supervision of other normal pregnancy, unspecified trimester 08/21/2019  . Morbid obesity with BMI of 40.0-44.9, adult (HCC) 07/30/2016    Assessment/Plan:  Kristin Morgan is a 24 y.o. G3P2002 at [redacted]w[redacted]d here for IOL for gHTN  #Induction of Labor: initial exam 1.5/50/-3. FB and cytotec placed without issue.  #gestational HTN: PEC labs pending, MR pressure on admission. Monitor for s/s of PEC.    #Pain: Desires un-medicated if possible  #FWB: Cat I  #ID:  gbs neg  #MOF: breast #MOC: debating between OP v IP IUD. Discussed both with patient at bedside and she will get back to a provider later tonight.     Gita Kudo, MD  02/17/2020, 5:17 PM

## 2020-02-18 ENCOUNTER — Inpatient Hospital Stay (HOSPITAL_COMMUNITY): Payer: Medicaid Other | Admitting: Anesthesiology

## 2020-02-18 ENCOUNTER — Encounter (HOSPITAL_COMMUNITY): Payer: Self-pay | Admitting: Family Medicine

## 2020-02-18 DIAGNOSIS — Z30014 Encounter for initial prescription of intrauterine contraceptive device: Secondary | ICD-10-CM

## 2020-02-18 DIAGNOSIS — O134 Gestational [pregnancy-induced] hypertension without significant proteinuria, complicating childbirth: Secondary | ICD-10-CM

## 2020-02-18 DIAGNOSIS — Z3A38 38 weeks gestation of pregnancy: Secondary | ICD-10-CM

## 2020-02-18 LAB — CBC
HCT: 33.2 % — ABNORMAL LOW (ref 36.0–46.0)
Hemoglobin: 10.1 g/dL — ABNORMAL LOW (ref 12.0–15.0)
MCH: 21.8 pg — ABNORMAL LOW (ref 26.0–34.0)
MCHC: 30.4 g/dL (ref 30.0–36.0)
MCV: 71.6 fL — ABNORMAL LOW (ref 80.0–100.0)
Platelets: 265 10*3/uL (ref 150–400)
RBC: 4.64 MIL/uL (ref 3.87–5.11)
RDW: 16.9 % — ABNORMAL HIGH (ref 11.5–15.5)
WBC: 20.6 10*3/uL — ABNORMAL HIGH (ref 4.0–10.5)
nRBC: 0 % (ref 0.0–0.2)

## 2020-02-18 LAB — RPR: RPR Ser Ql: NONREACTIVE

## 2020-02-18 LAB — PROTEIN / CREATININE RATIO, URINE
Creatinine, Urine: 153.71 mg/dL
Protein Creatinine Ratio: 0.95 mg/mg{Cre} — ABNORMAL HIGH (ref 0.00–0.15)
Total Protein, Urine: 146 mg/dL

## 2020-02-18 MED ORDER — DOCUSATE SODIUM 100 MG PO CAPS
100.0000 mg | ORAL_CAPSULE | Freq: Two times a day (BID) | ORAL | Status: DC
  Administered 2020-02-19 (×2): 100 mg via ORAL
  Filled 2020-02-18 (×2): qty 1

## 2020-02-18 MED ORDER — LEVONORGESTREL 19.5 MCG/DAY IU IUD
INTRAUTERINE_SYSTEM | Freq: Once | INTRAUTERINE | Status: AC
  Administered 2020-02-18: 10:00:00 1 via INTRAUTERINE
  Filled 2020-02-18: qty 1

## 2020-02-18 MED ORDER — DIPHENHYDRAMINE HCL 25 MG PO CAPS: 25.0000 mg | ORAL_CAPSULE | Freq: Four times a day (QID) | ORAL | Status: DC | PRN

## 2020-02-18 MED ORDER — DIBUCAINE (PERIANAL) 1 % EX OINT: 1.0000 "application " | TOPICAL_OINTMENT | CUTANEOUS | Status: DC | PRN

## 2020-02-18 MED ORDER — BENZOCAINE-MENTHOL 20-0.5 % EX AERO: 1.0000 "application " | INHALATION_SPRAY | CUTANEOUS | Status: DC | PRN

## 2020-02-18 MED ORDER — SENNOSIDES-DOCUSATE SODIUM 8.6-50 MG PO TABS
2.0000 | ORAL_TABLET | ORAL | Status: DC
  Administered 2020-02-19: 2 via ORAL
  Filled 2020-02-18: qty 2

## 2020-02-18 MED ORDER — PRENATAL MULTIVITAMIN CH
1.0000 | ORAL_TABLET | Freq: Every day | ORAL | Status: DC
  Administered 2020-02-19: 1 via ORAL
  Filled 2020-02-18: qty 1

## 2020-02-18 MED ORDER — COCONUT OIL OIL: 1.0000 "application " | TOPICAL_OIL | Status: DC | PRN

## 2020-02-18 MED ORDER — INFLUENZA VAC SPLIT QUAD 0.5 ML IM SUSY
0.5000 mL | PREFILLED_SYRINGE | INTRAMUSCULAR | Status: DC
  Filled 2020-02-18: qty 0.5

## 2020-02-18 MED ORDER — LIDOCAINE-EPINEPHRINE (PF) 2 %-1:200000 IJ SOLN
INTRAMUSCULAR | Status: DC | PRN
  Administered 2020-02-18: 5 mL via EPIDURAL

## 2020-02-18 MED ORDER — WITCH HAZEL-GLYCERIN EX PADS: 1.0000 "application " | MEDICATED_PAD | CUTANEOUS | Status: DC | PRN

## 2020-02-18 MED ORDER — ONDANSETRON HCL 4 MG/2ML IJ SOLN: 4.0000 mg | INTRAMUSCULAR | Status: DC | PRN

## 2020-02-18 MED ORDER — ACETAMINOPHEN 325 MG PO TABS
650.0000 mg | ORAL_TABLET | ORAL | Status: DC | PRN
  Administered 2020-02-19: 650 mg via ORAL
  Filled 2020-02-18: qty 2

## 2020-02-18 MED ORDER — IBUPROFEN 600 MG PO TABS
600.0000 mg | ORAL_TABLET | Freq: Four times a day (QID) | ORAL | Status: DC
  Administered 2020-02-18 – 2020-02-19 (×5): 600 mg via ORAL
  Filled 2020-02-18 (×5): qty 1

## 2020-02-18 MED ORDER — BUPIVACAINE HCL (PF) 0.75 % IJ SOLN
INTRAMUSCULAR | Status: DC | PRN
Start: 2020-02-18 — End: 2020-02-19
  Administered 2020-02-18: 12 mL/h via EPIDURAL

## 2020-02-18 MED ORDER — SIMETHICONE 80 MG PO CHEW: 80.0000 mg | CHEWABLE_TABLET | ORAL | Status: DC | PRN

## 2020-02-18 MED ORDER — ONDANSETRON HCL 4 MG PO TABS: 4.0000 mg | ORAL_TABLET | ORAL | Status: DC | PRN

## 2020-02-18 NOTE — Procedures (Signed)
  Post-Placental IUD Insertion Procedure Note  Patient identified, informed consent signed prior to delivery, signed copy in chart, time out was performed.    Vaginal, labial and perineal areas thoroughly inspected for lacerations. No laceration identified - not hemostatic,repaired / hemostatic, not repaired prior to insertion of IUD.    Liletta IUD grasped between sterile gloved fingers. Sterile lubrication applied to sterile gloved hand for ease of insertion. Fundus identified through abdominal wall using non-insertion hand. IUD inserted to fundus with bimanual technique. IUD carefully released at the fundus and insertion hand gently removed from vagina.    Strings trimmed to the level of the introitus. Patient tolerated procedure well.  Lot # [id number] Expiration Date 05/11/23  Patient given post procedure instructions and IUD care card with expiration date.  Patient is asked to keep IUD strings tucked in her vagina until her postpartum follow up visit in 4-6 weeks. Patient advised to abstain from sexual intercourse and pulling on strings before her follow-up visit. Patient verbalized an understanding of the plan of care and agrees.    Casper Harrison, MD [facility] Family Medicine Fellow, [facility] for [facility], [facility]

## 2020-02-18 NOTE — Lactation Note (Signed)
This note was copied from a baby's chart. Lactation Consultation Note  Patient Name: Kristin Morgan [id number] Date: 02/18/2020 Reason for consult: Initial assessment;Mother's request;1st time breastfeeding;Early term 37-38.6wks;Other (Comment) (Gestational Hypertension)  Infant is 56 weeks 6 hours old. Mom's first time breastfeeding. Infant latched twice as per Mom for 15-20 minutes at 10: 20 am and 2:20 pm. Infant had 1 urine since birth.   Mom had a pacifier at the bedside. LC reviewed with Mom to delay pacifier use for first 4 weeks to establish a good latch. Mom's nipples are erect and compressible. LC able to hand express drops of colostrum and spoon feed to infant. LC latched infant on the left side in football where she nursed for 25 minutes.   Mom has a Lansinoh pump at home and her plan is to EBF.   Plan 1. To feed based on cues 8-12x in 24 hour period no more than three hours without an attempt. Mom can pre pump or hand express getting drops of colostrum to offer before latching.          2. Manual pump given to increase stimulation.Pump parts, assembly, cleaning and milk storage reviewed.          3. I's and O's sheet reviewed with parents.          4. LC brochure of inpatient and outpatient services reviewed.         5. All questions posed by parents answered by the end of the visit.

## 2020-02-18 NOTE — Progress Notes (Signed)
Patient ID: Kristin Morgan, female   DOB: [birth year], 24 y.o.   MRN: [id number]  Had SROM at 0032 for clear fluid and has felt ctx stronger since; now feel pressure w each one and although she was trying to avoid the epidural is now wanting one unless she has made significant progress  BP 123/79, P 89 FHR 130s, +variability, early variables most ctx Ctx q 2 mins with Pit at 79mu/min Cx 7cm (was 6-7 an hour ago)  IUP@38 .5wks gHTN Active labor  Anesthesia notified and plan to place epidural, then will continue to change positions to help facilitate fetal rotation/descent Anticipate vag del  Arabella Merles CNM 02/18/2020 2:06 AM

## 2020-02-18 NOTE — Anesthesia Preprocedure Evaluation (Signed)
Anesthesia Evaluation  Patient identified by MRN, date of birth, ID band Patient awake    Reviewed: Allergy & Precautions, NPO status , Patient's Chart, lab work & pertinent test results  Airway Mallampati: II  TM Distance: >3 FB Neck ROM: Full    Dental no notable dental hx.    Pulmonary neg pulmonary ROS,    Pulmonary exam normal breath sounds clear to auscultation       Cardiovascular hypertension, Normal cardiovascular exam Rhythm:Regular Rate:Normal     Neuro/Psych negative neurological ROS  negative psych ROS   GI/Hepatic negative GI ROS, Neg liver ROS,   Endo/Other  Morbid obesity (BMI 43)  Renal/GU negative Renal ROS  negative genitourinary   Musculoskeletal negative musculoskeletal ROS (+)   Abdominal   Peds  Hematology negative hematology ROS (+)   Anesthesia Other Findings IOL for gHTN  Reproductive/Obstetrics (+) Pregnancy                             Anesthesia Physical Anesthesia Plan  ASA: III  Anesthesia Plan: Epidural   Post-op Pain Management:    Induction:   PONV Risk Score and Plan: Treatment may vary due to age or medical condition  Airway Management Planned: Natural Airway  Additional Equipment:   Intra-op Plan:   Post-operative Plan:   Informed Consent: I have reviewed the patients History and Physical, chart, labs and discussed the procedure including the risks, benefits and alternatives for the proposed anesthesia with the patient or authorized representative who has indicated his/her understanding and acceptance.       Plan Discussed with: Anesthesiologist  Anesthesia Plan Comments: (Patient identified. Risks, benefits, options discussed with patient including but not limited to bleeding, infection, nerve damage, paralysis, failed block, incomplete pain control, headache, blood pressure changes, nausea, vomiting, reactions to medication, itching,  and post partum back pain. Confirmed with bedside nurse the patient's most recent platelet count. Confirmed with the patient that they are not taking any anticoagulation, have any bleeding history or any family history of bleeding disorders. Patient expressed understanding and wishes to proceed. All questions were answered. )        Anesthesia Quick Evaluation

## 2020-02-18 NOTE — Progress Notes (Signed)
Labor Progress Note Margarethe Virgen is a 24 y.o. G3P2002 at [redacted]w[redacted]d presented for IOL for gHTN S: Feeling pressure with contractions   O:  BP 136/69   Pulse (!) 117   Temp 98.1 F (36.7 C) (Oral)   Resp 16   LMP 05/16/2019   SpO2 98%  EFM: 150/mod variability/pos accels/occasional variable decels   CVE: Dilation: 8 Effacement (%): 80 Cervical Position: Middle Station: -1 Presentation: Vertex Exam by:: Deatra Robinson, RNC   A&P: 24 y.o. [id number] [redacted]w[redacted]d here for IOL for gHTN  #IOL for gHTN: s/p cytotec, FB, Pitocin started at 2300, paused at 0200 for cat II tracing. Restarted at 7am. IUPC placed due to limited cervical change. MVU currently 180-200.  Benadryl x1. Recheck in 3-4 hours.    #gHTN: MR pressures since admission. Normal PEC labs.  #contraception: PPIUD ordered/consented.   #Pain: epidural in place  #FWB: cat I  #GBS negative    Gita Kudo, MD 9:07 AM

## 2020-02-18 NOTE — Progress Notes (Signed)
Patient ID: Kristin Morgan, female   DOB: April 30, 1995, 24 y.o.   MRN: [id number]  Standing/walking in room; starting to feel ctx more; cervical foley came out and Pitocin recently started; is s/p cytotec x 1 dose  BP 132/70, P 93 FHR 130s, +accels, no decels, occ variables Ctx irreg, Pitocin at 30mu/min Cx deferred (was 4-5/50/vtx -3 at start of Pit)  IUP@38 .5wks gHTN Cx favorable  Continue to titrate Pit to achieve adequate labor Anticipate vag del  Arabella Merles CNM 02/18/2020 12:20 AM

## 2020-02-18 NOTE — Discharge Summary (Signed)
Postpartum Discharge Summary  Date of Service updated 02/19/20      Patient Name: Kristin Morgan DOB: [birth year] MRN: [id number]  Date of admission: 02/17/2020 Delivery date:02/18/2020  Delivering provider: Janet Berlin  Date of discharge: 02/19/2020  Admitting diagnosis: Gestational hypertension [O13.9] Intrauterine pregnancy: [redacted]w[redacted]d    Secondary diagnosis:  Active Problems:   Morbid [place] with BMI of 40.0-44.9, [place]   Gestational hypertension   Encounter for initial prescription of intrauterine contraceptive device (IUD)   Vaginal delivery  Additional problems: none    Discharge diagnosis: Term Pregnancy Delivered and Gestational Hypertension                                              Post partum procedures:post placental iud insertion Augmentation: AROM, Pitocin, Cytotec and IP Foley Complications: None  Hospital course: Induction of Labor With Vaginal Delivery   24y.o. yo G3P3003 at 331w5das admitted to the hospital 02/17/2020 for induction of labor.  Indication for induction: Gestational hypertension.  Patient had an uncomplicated labor course as follows: Membrane Rupture Time/Date: 12:32 AM ,02/18/2020   Delivery Method:Vaginal, Spontaneous  Episiotomy: None  Lacerations:  None  Details of delivery can be found in separate delivery note.  Patient had a routine postpartum course. Patient is discharged home 02/19/20.  Newborn Data: Birth date:02/18/2020  Birth time:9:50 AM  Gender:Female  Living status:Living  Apgars:9 ,9  Weight:2775 g   Magnesium Sulfate received: No BMZ received: No Rhophylac:N/A MMR:N/A T-DaP:Given prenatally Flu: No Transfusion:No  Physical exam  Vitals:   02/19/20 0637 02/19/20 0800 02/19/20 1030 02/19/20 1245  BP: 125/74 125/67 121/75 136/77  Pulse: 89 72 80 85  Resp: 18     Temp: 98.2 F (36.8 C)     TempSrc: Oral     SpO2: 99%      General: alert Lochia: appropriate Uterine Fundus: firm Incision: Dressing  is clean, dry, and intact DVT Evaluation: No evidence of DVT seen on physical exam. No cords or calf tenderness. No significant calf/ankle edema. Labs: Lab Results  Component Value Date   WBC 20.6 (H) 02/18/2020   HGB 10.1 (L) 02/18/2020   HCT 33.2 (L) 02/18/2020   MCV 71.6 (L) 02/18/2020   PLT 265 02/18/2020   CMP Latest Ref Rng & Units 02/17/2020  Glucose 70 - 99 mg/dL 135(H)  BUN 6 - 20 mg/dL 7  Creatinine 0.44 - 1.00 mg/dL 0.60  Sodium 135 - 145 mmol/L 136  Potassium 3.5 - 5.1 mmol/L 3.3(L)  Chloride 98 - 111 mmol/L 105  CO2 22 - 32 mmol/L 18(L)  Calcium 8.9 - 10.3 mg/dL 8.4(L)  Total Protein 6.5 - 8.1 g/dL 6.3(L)  Total Bilirubin 0.3 - 1.2 mg/dL 0.7  Alkaline Phos 38 - 126 U/L 126  AST 15 - 41 U/L 24  ALT 0 - 44 U/L 18   Edinburgh Score: No flowsheet data found.   After visit meds:  Allergies as of 02/19/2020      Reactions   Aspirin Nausea And Vomiting   Sulfa Antibiotics Hives      Medication List    STOP taking these medications   Blood Pressure Monitor Misc   Doxylamine-Pyridoxine 10-10 MG Tbec Commonly known as: Diclegis   ferrous sulfate 325 (65 FE) MG tablet     TAKE these medications   acetaminophen 325 MG tablet Commonly  known as: Tylenol Take 2 tablets (650 mg total) by mouth every 4 (four) hours as needed (for pain scale < 4).   amLODipine 5 MG tablet Commonly known as: NORVASC Take 1 tablet (5 mg total) by mouth daily.   coconut oil Oil Apply 1 application topically as needed.   ibuprofen 600 MG tablet Commonly known as: ADVIL Take 1 tablet (600 mg total) by mouth every 6 (six) hours.   prenatal multivitamin Tabs tablet Take 1 tablet by mouth daily at 12 noon. What changed:   how much to take  when to take this        Discharge home in stable condition Infant Feeding: Breast Infant Disposition:home with mother Discharge instruction: per After Visit Summary and Postpartum booklet. Activity: Advance as tolerated. Pelvic  rest for 6 weeks.  Diet: routine diet Future Appointments: Future Appointments  Date Time Provider [place]  02/25/2020 10:10 AM [facility] NURSE [facility]  03/24/2020  1:30 PM Cresenzo-Dishmon, Joaquim Lai, CNM [facility]   Follow up Visit:  Follow-up Information    [facility]. Schedule an appointment as soon as possible for a visit in 4 week(s).   Specialty: Obstetrics and Gynecology Why: Make an appointment with your [facility] provider for a 4-6 week follow up.  Contact information: [facility]. Go to.   Specialty: Obstetrics and Gynecology Why: Return to the MAU if you develop any of the following: - fever of 100.4*F or higher - worsening bleeding  - worsening pain not relieved by tylenol or ibuprofen - feel unsafe caring for yourself or baby  Contact information: [address] [id number] [place] [phone]               Please schedule this patient for a In person postpartum visit in 6 weeks with the following provider: Any provider. Additional Postpartum F/U:BP check 1 week, string check at 6 weeks   Low risk pregnancy complicated by: HTN Delivery mode:  Vaginal, Spontaneous  Anticipated Birth Control:  PP IUD placed   02/19/2020 Baldo Ash, MD

## 2020-02-18 NOTE — Progress Notes (Signed)
Patient ID: Kristin Morgan, female   DOB: [birth year], 24 y.o.   MRN: [id number]  Pitocin stopped at 0200 due to FHR variables with each ctx; epidural placed and pt still feeling some pressure but not feeling ctx; has been able to rest a little bit, but she is becoming discouraged with the process; has been in a variety of positions overnight  BP 145/74, P 112 FHR 140s, +accels, +SS, some variables Ctx difficult to monitor; IUPC inserted  Cx 8/ant lip a little puffy/vtx -1  IUP@38 .5wks gHTN Protracted active phase, suspect inadequate ctx  Will eval MVUs- most likely will need some Pitocin restarted; can use IUPC for amnioinfusion if indicated Keep rotating positions to facilitate fetal rotation/descent Will check on urine p/c ratio Hopeful for vag del  Arabella Merles CNM 02/18/2020

## 2020-02-18 NOTE — Anesthesia Procedure Notes (Signed)
Epidural Patient location during procedure: OB Start time: 02/18/2020 2:21 AM End time: 02/18/2020 2:31 AM  Staffing Anesthesiologist: Elmer Picker, MD Performed: anesthesiologist   Preanesthetic Checklist Completed: patient identified, IV checked, risks and benefits discussed, monitors and equipment checked, pre-op evaluation and timeout performed  Epidural Patient position: sitting Prep: DuraPrep and site prepped and draped Patient monitoring: continuous pulse ox, blood pressure, heart rate and cardiac monitor Approach: midline Location: L3-L4 Injection technique: LOR air  Needle:  Needle type: Tuohy  Needle gauge: 17 G Needle length: 9 cm Needle insertion depth: 7 cm Catheter type: closed end flexible Catheter size: 19 Gauge Catheter at skin depth: 13 cm Test dose: negative  Assessment Sensory level: T8 Events: blood not aspirated, injection not painful, no injection resistance, no paresthesia and negative IV test  Additional Notes Patient identified. Risks/Benefits/Options discussed with patient including but not limited to bleeding, infection, nerve damage, paralysis, failed block, incomplete pain control, headache, blood pressure changes, nausea, vomiting, reactions to medication both or allergic, itching and postpartum back pain. Confirmed with bedside nurse the patient's most recent platelet count. Confirmed with patient that they are not currently taking any anticoagulation, have any bleeding history or any family history of bleeding disorders. Patient expressed understanding and wished to proceed. All questions were answered. Sterile technique was used throughout the entire procedure. Please see nursing notes for vital signs. Test dose was given through epidural catheter and negative prior to continuing to dose epidural or start infusion. Warning signs of high block given to the patient including shortness of breath, tingling/numbness in hands, complete motor block,  or any concerning symptoms with instructions to call for help. Patient was given instructions on fall risk and not to get out of bed. All questions and concerns addressed with instructions to call with any issues or inadequate analgesia.  Reason for block:procedure for pain

## 2020-02-18 NOTE — Anesthesia Postprocedure Evaluation (Signed)
Anesthesia Post Note  Patient: Kristin Morgan  Procedure(s) Performed: AN AD HOC LABOR EPIDURAL     Patient location during evaluation: Mother Baby Anesthesia Type: Epidural Level of consciousness: awake and alert Pain management: pain level controlled Vital Signs Assessment: post-procedure vital signs reviewed and stable Respiratory status: spontaneous breathing, nonlabored ventilation and respiratory function stable Cardiovascular status: stable Postop Assessment: no headache, no backache and epidural receding Anesthetic complications: no   No complications documented.  Last Vitals:  Vitals:   02/18/20 1300 02/18/20 1632  BP: (!) 146/82 131/73  Pulse: (!) 115 98  Resp: 16 16  Temp: 37.1 C 36.9 C  SpO2: 99% 98%    Last Pain:  Vitals:   02/18/20 1632  TempSrc: Oral  PainSc: 0-No pain   Pain Goal:                Epidural/Spinal Function Cutaneous sensation: Normal sensation (02/18/20 1632), Patient able to flex knees: Yes (02/18/20 1632), Patient able to lift hips off bed: Yes (02/18/20 1632), Back pain beyond tenderness at insertion site: No (02/18/20 1632), Progressively worsening motor and/or sensory loss: No (02/18/20 1632), Bowel and/or bladder incontinence post epidural: No (02/18/20 1632)  Emmaline Kluver N

## 2020-02-19 MED ORDER — AMLODIPINE BESYLATE 5 MG PO TABS
5.0000 mg | ORAL_TABLET | Freq: Every day | ORAL | 1 refills | Status: DC
Start: 2020-02-19 — End: 2021-01-16

## 2020-02-19 MED ORDER — COCONUT OIL OIL
1.0000 | TOPICAL_OIL | 0 refills | Status: DC | PRN
Start: 2020-02-19 — End: 2020-03-07

## 2020-02-19 MED ORDER — AMLODIPINE BESYLATE 5 MG PO TABS: 5.0000 mg | ORAL_TABLET | Freq: Every day | ORAL | Status: DC

## 2020-02-19 MED ORDER — ACETAMINOPHEN 325 MG PO TABS
650.0000 mg | ORAL_TABLET | ORAL | 0 refills | Status: DC | PRN
Start: 2020-02-19 — End: 2020-03-07

## 2020-02-19 MED ORDER — IBUPROFEN 600 MG PO TABS
600.0000 mg | ORAL_TABLET | Freq: Four times a day (QID) | ORAL | 0 refills | Status: DC
Start: 2020-02-19 — End: 2020-03-07

## 2020-02-19 MED ORDER — PRENATAL MULTIVITAMIN CH
1.0000 | ORAL_TABLET | Freq: Every day | ORAL | Status: DC
Start: 2020-02-19 — End: 2021-01-16

## 2020-02-19 NOTE — Discharge Instructions (Signed)

## 2020-02-19 NOTE — Progress Notes (Signed)
Post Partum Day #1 Subjective: Patient reports doing well without any complaints. Patient notes pain and bleeding are improving. Patient is up and walking, eating, and drinking. Patient is breast and bottle feeding. Patient had IUD placed yesterday.  Objective: Blood pressure 125/67, pulse 72, temperature 98.2 F (36.8 C), temperature source Oral, resp. rate 18, last menstrual period 05/16/2019, SpO2 99 %, unknown if currently breastfeeding.  Physical Exam:  General: alert, cooperative and no distress Lochia: Appropriate Uterine Fundus: Firm DVT Evaluation: No evidence of DVT. No swelling, discoloration, or pain.  Recent Labs    02/17/20 1646 02/18/20 1022  HGB 10.4* 10.1*  HCT 34.8* 33.2*    Assessment/Plan: Plan for discharge this afternoon.  Patient BP elevated yesterday at 146/82 but most recent BP 125/67. Continue to monitor BP every 1-2 hours until discharge. If BP elevated before discharge, start patient on medication for BP. Patient breast and bottle feeding. Post placental IUD placed yesterday for birth control.    LOS: 2 days   Gigi Gin 02/19/2020, 8:57 AM

## 2020-02-19 NOTE — Social Work (Signed)
CSW received consult for history of Depression.  CSW met with MOB to offer support and complete assessment.     CSW introduced self and role. CSW observed newborn being held by [facility] and FOB bedside. CSW asked MOB if she would like to speak alone for privacy, MOB declined and stated FOB could remain in the room. CSW informed MOB of the reason for consult. MOB expressed understanding and disclosed she has a history of depression dating back 8 years. MOB stated she never took medications for the diagnosis. MOB reported she attended therapy at the time and found it to not be helpful. CSW identifies FOB and her friends as supports. MOB expressed she is currently doing well and denied any SI or HI.   CSW provided education regarding the baby blues period vs. perinatal mood disorders, discussed treatment and offered resources for mental health follow up if concerns arise.  CSW recommends self-evaluation during the postpartum time period using the New Mom Checklist from Postpartum Progress and encouraged MOB to contact a medical professional if symptoms are noted at any time.   CSW provided review of Sudden Infant Death Syndrome (SIDS) precautions. MOB stated newborn will sleep in a bassinet. MOB reports having all of the essential needs for newborn to discharge home, including a new carseat. MOB stated baby will receive follow-up care at [place]. MOB declined additional resources or referrals to community agencies.  CSW identifies no further need for intervention and no barriers to discharge at this time.  Darra Lis, [place] [employer] [phone]

## 2020-02-22 ENCOUNTER — Telehealth: Payer: Self-pay

## 2020-02-22 NOTE — Telephone Encounter (Signed)
Transition Care Management Follow-up Telephone Call  Date of discharge and from where: 02/19/2020 [facility]  How have you been since you were released from the hospital? Doing good.   Any questions or concerns? No  Items Reviewed:  Did the pt receive and understand the discharge instructions provided? Yes   Medications obtained and verified? Yes   Other? No   Any new allergies since your discharge? No   Dietary orders reviewed? Yes  Do you have support at home? Yes   Home Care and Equipment/Supplies: Were home health services ordered? not applicable If so, what is the name of the agency? N/a   Has the agency set up a time to come to the patient's home? not applicable Were any new equipment or medical supplies ordered?  No What is the name of the medical supply agency? n/a Were you able to get the supplies/equipment? not applicable Do you have any questions related to the use of the equipment or supplies? No  Functional Questionnaire: (I = Independent and D = Dependent) ADLs: I  Bathing/Dressing- I  Meal Prep- I  Eating- I  Maintaining continence- I  Transferring/Ambulation- I  Managing Meds- I  Follow up appointments reviewed:   PCP Hospital f/u appt confirmed? No    Specialist Hospital f/u appt confirmed? Yes  Scheduled to see Jacklyn Shell on 03/24/20 @ 1:30PM.  Are transportation arrangements needed? No   If their condition worsens, is the pt aware to call PCP or go to the Emergency Dept.? Yes  Was the patient provided with contact information for the PCP's office or ED? Yes  Was to pt encouraged to call back with questions or concerns? Yes

## 2020-02-22 NOTE — Telephone Encounter (Signed)
Transition Care Management Unsuccessful Follow-up Telephone Call  Date of discharge and from where:  02/19/2020 [facility]  Attempts:  1st Attempt  Reason for unsuccessful TCM follow-up call:  Left voice message

## 2020-02-24 ENCOUNTER — Telehealth: Payer: Self-pay | Admitting: *Deleted

## 2020-02-24 NOTE — Telephone Encounter (Signed)
Pt got IUD placed in hospital after delivery. Pt feels the strings outside of her body. Call transferred to front desk for appt. To check IUD and BP check. JSY

## 2020-02-24 NOTE — Telephone Encounter (Signed)
Patient called stating she wants to talk to the nurse regarding her IUD she thinks something may be wrong with it.    [phone]

## 2020-02-27 ENCOUNTER — Inpatient Hospital Stay (HOSPITAL_COMMUNITY): Admit: 2020-02-27 | Payer: Self-pay

## 2020-02-29 ENCOUNTER — Ambulatory Visit (INDEPENDENT_AMBULATORY_CARE_PROVIDER_SITE_OTHER): Payer: Medicaid Other | Admitting: Women's Health

## 2020-02-29 ENCOUNTER — Encounter: Payer: Self-pay | Admitting: Women's Health

## 2020-02-29 ENCOUNTER — Other Ambulatory Visit: Payer: Self-pay

## 2020-02-29 VITALS — BP 130/77 | HR 98 | Ht 62.0 in | Wt 224.2 lb

## 2020-02-29 DIAGNOSIS — Z0130 Encounter for examination of blood pressure without abnormal findings: Secondary | ICD-10-CM | POA: Diagnosis not present

## 2020-02-29 DIAGNOSIS — Z30431 Encounter for routine checking of intrauterine contraceptive device: Secondary | ICD-10-CM

## 2020-02-29 NOTE — Patient Instructions (Signed)
Stop amlodipine 2 days before your postpartum visit

## 2020-02-29 NOTE — Progress Notes (Signed)
   GYN VISIT Patient name: Kristin Morgan MRN [id number]  Date of birth: Jul 19, 1995 Chief Complaint:   Blood Pressure Check (and IUD check?)  History of Present Illness:   Kristin Morgan is a 24 y.o. [id number] Caucasian female 11d s/p SVB after IOL for GHTN being seen today for bp and IUD check.  Postplacental IUD placed, reports strings are hanging out of vagina. D/C'd on norvasc 5mg . No problems w/ PPD, feels great.  Depression screen [facility] 2/9 08/21/2019 08/04/2019 07/08/2019 06/02/2018 07/30/2016  Decreased Interest 0 0 0 0 0  Down, Depressed, Hopeless 0 0 0 0 0  PHQ - 2 Score 0 0 0 0 0  Altered sleeping 0 - 1 - -  Tired, decreased energy 0 - 1 - -  Change in appetite 0 - 0 - -  Feeling bad or failure about yourself  0 - 0 - -  Trouble concentrating 0 - 0 - -  Moving slowly or fidgety/restless 0 - 0 - -  Suicidal thoughts 0 - 0 - -  PHQ-9 Score 0 - 2 - -  Difficult doing work/chores Not difficult at all - Not difficult at all - -    No LMP recorded. The current method of family planning is abstinence and IUD.  Last pap 07/08/19. Results were:  normal Review of Systems:   Pertinent items are noted in HPI Denies fever/chills, dizziness, headaches, visual disturbances, fatigue, shortness of breath, chest pain, abdominal pain, vomiting, abnormal vaginal discharge/itching/odor/irritation, problems with periods, bowel movements, urination, or intercourse unless otherwise stated above.  Pertinent History Reviewed:  Reviewed past medical,surgical, social, obstetrical and family history.  Reviewed problem list, medications and allergies. Physical Assessment:   Vitals:   02/29/20 0855  BP: 130/77  Pulse: 98  Weight: 224 lb 3.2 oz (101.7 kg)  Height: 5\' 2"  (1.575 m)  Body mass index is 41.01 kg/m.       Physical Examination:   General appearance: alert, well appearing, and in no distress  Mental status: alert, oriented to person, place, and time  Skin: warm & dry   Cardiovascular: normal heart  rate noted  Respiratory: normal respiratory effort, no distress  Abdomen: soft, non-tender   Pelvic: VULVA: normal appearing vulva with no masses, tenderness or lesions, IUD strings are hanging out about 5in from vagina VAGINA: normal appearing vagina with normal color and discharge, no lesions, CERVIX: normal appearing cervix without discharge or lesions  Extremities: no edema   Informal TA u/s: IUD in mid-lower uterine segment, above cx, confirmed by 03/02/20, ultrasonographer  IUD strings trimmed to 3cm  Chaperone:    No results found for this or any previous visit (from the past 24 hour(s)).  Assessment & Plan:  1) 11d s/p SVB after IOL for GHTN> bottlefeeding  2) BP check> doing well on amlodipine 5mg , stop 2d before pp visit  3) IUD check> in mid-LUS, per UTD still effective as long as above cervix. Strings trimmed, abstinence until pp visit  Meds: No orders of the defined types were placed in this encounter.   No orders of the defined types were placed in this encounter.   Return for As scheduled for pp visit.  [facility] CNM, [facility] 02/29/2020 9:41 AM

## 2020-03-07 ENCOUNTER — Encounter: Payer: Self-pay | Admitting: Family

## 2020-03-07 ENCOUNTER — Ambulatory Visit (INDEPENDENT_AMBULATORY_CARE_PROVIDER_SITE_OTHER): Payer: Medicaid Other | Admitting: Family

## 2020-03-07 VITALS — BP 129/82 | HR 106 | Temp 96.9°F | Ht 62.0 in | Wt 223.0 lb

## 2020-03-07 DIAGNOSIS — L309 Dermatitis, unspecified: Secondary | ICD-10-CM

## 2020-03-07 MED ORDER — TRIAMCINOLONE ACETONIDE 0.5 % EX OINT: 1.0000 "application " | TOPICAL_OINTMENT | Freq: Two times a day (BID) | CUTANEOUS | 2 refills | Status: DC

## 2020-03-07 NOTE — Progress Notes (Signed)
   Subjective:    Patient ID: Kristin Morgan, female    DOB: [birth year], 24 y.o.   MRN: [id number]  Chief Complaint  Patient presents with  . Rash    Rash This is a chronic problem. The current episode started more than 1 year ago. The problem has been waxing and waning since onset. Location: right hand. The rash is characterized by burning and itchiness. She was exposed to nothing. Pertinent negatives include no fatigue, fever, shortness of breath or sore throat. Past treatments include anti-itch cream. The treatment provided no relief.      Review of Systems  Constitutional: Negative for fatigue and fever.  HENT: Negative for sore throat.   Respiratory: Negative for shortness of breath.   Skin: Positive for rash.  All other systems reviewed and are negative.      Objective:   Physical Exam Vitals reviewed.  Constitutional:      General: She is not in acute distress.    Appearance: She is well-developed. She is obese.  HENT:     Head: Normocephalic and atraumatic.  Eyes:     Pupils: Pupils are equal, round, and reactive to light.  Neck:     Thyroid: No thyromegaly.  Cardiovascular:     Rate and Rhythm: Normal rate and regular rhythm.     Heart sounds: Normal heart sounds. No murmur heard.   Pulmonary:     Effort: Pulmonary effort is normal. No respiratory distress.     Breath sounds: Normal breath sounds. No wheezing.  Abdominal:     General: Bowel sounds are normal. There is no distension.     Palpations: Abdomen is soft.     Tenderness: There is no abdominal tenderness.  Musculoskeletal:        General: No tenderness. Normal range of motion.     Cervical back: Normal range of motion and neck supple.  Skin:    General: Skin is warm and dry.     Findings: Rash present.     Comments: Erythemas dry, scaly rash on right palm   Neurological:     Mental Status: She is alert and oriented to person, place, and time.     Cranial Nerves: No cranial nerve deficit.     Deep  Tendon Reflexes: Reflexes are normal and symmetric.  Psychiatric:        Behavior: Behavior normal.        Thought Content: Thought content normal.        Judgment: Judgment normal.          BP 129/82   Pulse (!) 106   Temp (!) 96.9 F (36.1 C) (Temporal)   Ht 5\' 2"  (1.575 m)   Wt 223 lb (101.2 kg)   BMI 40.79 kg/m   Assessment & Plan:  1. Eczema, unspecified type Do not scratch Keep clean and dry Keep moisturized  Avoid hot showers - triamcinolone ointment (KENALOG) 0.5 %; Apply 1 application topically 2 (two) times daily.  Dispense: 60 g; Refill: 2   , FNP

## 2020-03-07 NOTE — Patient Instructions (Signed)

## 2020-03-18 DIAGNOSIS — H5213 Myopia, bilateral: Secondary | ICD-10-CM | POA: Diagnosis not present

## 2020-03-24 ENCOUNTER — Ambulatory Visit: Payer: Medicaid Other | Admitting: Advanced Practice Midwife

## 2020-04-09 DIAGNOSIS — L02412 Cutaneous abscess of left axilla: Secondary | ICD-10-CM | POA: Diagnosis not present

## 2020-04-18 ENCOUNTER — Telehealth: Payer: Self-pay

## 2020-04-18 NOTE — Telephone Encounter (Signed)
Pt got an IUD placed in hospital after delivery and she has been bleeding x 2 months. Pt was advised it's not uncommon to have irregular bleeding when you start a new birth control. With the IUD, you can have irregular bleeding for the first 6 months. Pt states bleeding is not heavy all the time, she was just wondering if this was normal. Pt voiced understanding. JSY

## 2020-04-18 NOTE — Telephone Encounter (Signed)
Pt called stating she has concerns about bleeding for "almost 2 months" she stated that she had an IUD placed at the hospital when she gave birth. No pain just worried about how long it might last.

## 2020-05-18 ENCOUNTER — Other Ambulatory Visit: Payer: Self-pay | Admitting: Family Medicine

## 2020-05-18 MED ORDER — BUSPIRONE HCL 10 MG PO TABS: 10.0000 mg | ORAL_TABLET | Freq: Three times a day (TID) | ORAL | 2 refills | Status: DC

## 2020-05-18 NOTE — Progress Notes (Signed)
Started buspirone for Postpartum depression

## 2020-06-13 ENCOUNTER — Encounter: Payer: Self-pay | Admitting: Family Medicine

## 2020-06-13 ENCOUNTER — Ambulatory Visit: Payer: Medicaid Other | Admitting: Family Medicine

## 2020-06-13 DIAGNOSIS — J0100 Acute maxillary sinusitis, unspecified: Secondary | ICD-10-CM | POA: Diagnosis not present

## 2020-06-13 MED ORDER — PREDNISONE 10 MG (21) PO TBPK: ORAL_TABLET | ORAL | 0 refills | Status: DC

## 2020-06-13 MED ORDER — AMOXICILLIN-POT CLAVULANATE 875-125 MG PO TABS: 1.0000 | ORAL_TABLET | Freq: Two times a day (BID) | ORAL | 0 refills | Status: AC

## 2020-06-13 NOTE — Progress Notes (Signed)
Virtual Visit via Telephone Note  I connected with Kristin Morgan on 06/13/20 at 9:50 AM by telephone and verified that I am speaking with the correct person using two identifiers. Kristin Morgan is currently located at her neighbors and her son is currently with her during this visit. The provider, Gwenlyn Fudge, FNP is located in their office at time of visit.  I discussed the limitations, risks, security and privacy concerns of performing an evaluation and management service by telephone and the availability of in person appointments. I also discussed with the patient that there may be a patient responsible charge related to this service. The patient expressed understanding and agreed to proceed.  Subjective: PCP: Kristin Spencer, FNP  Chief Complaint  Patient presents with  . URI   Patient complains of headache, sore throat, ear pain/pressure, facial pain/pressure, postnasal drainage and nausea.  Facial pain/pressure is below her eyes.  She reports her throat is very red.  States the drainage going down the back of her throat is so thick it keeps getting stuck on her uvula.  Onset of symptoms was 3 days ago, rapidly worsening since that time. She is drinking plenty of fluids. Evaluation to date: none. Treatment to date: Tylenol and Mucinex.  She does not smoke. Patient has not had COVID-19 vaccines.    ROS: Per HPI  Current Outpatient Medications:  .  amLODipine (NORVASC) 5 MG tablet, Take 1 tablet (5 mg total) by mouth daily., Disp: 60 tablet, Rfl: 1 .  busPIRone (BUSPAR) 10 MG tablet, Take 1 tablet (10 mg total) by mouth 3 (three) times daily., Disp: 60 tablet, Rfl: 2 .  Prenatal Vit-Fe Fumarate-FA (PRENATAL MULTIVITAMIN) TABS tablet, Take 1 tablet by mouth daily at 12 noon., Disp: , Rfl:  .  triamcinolone ointment (KENALOG) 0.5 %, Apply 1 application topically 2 (two) times daily., Disp: 60 g, Rfl: 2  Allergies  Allergen Reactions  . Aspirin Nausea And Vomiting  . Sulfa Antibiotics  Hives   Past Medical History:  Diagnosis Date  . Depression   . Pregnant     Observations/Objective: A&O  No respiratory distress or wheezing audible over the phone Mood, judgement, and thought processes all WNL  Assessment and Plan: 1. Acute non-recurrent maxillary sinusitis Discussed symptom management.  Recommended testing for COVID-19, but patient declined. - predniSONE (STERAPRED UNI-PAK 21 TAB) 10 MG (21) TBPK tablet; As directed x 6 days  Dispense: 21 tablet; Refill: 0 - amoxicillin-clavulanate (AUGMENTIN) 875-125 MG tablet; Take 1 tablet by mouth 2 (two) times daily for 7 days.  Dispense: 14 tablet; Refill: 0   Follow Up Instructions:  I discussed the assessment and treatment plan with the patient. The patient was provided an opportunity to ask questions and all were answered. The patient agreed with the plan and demonstrated an understanding of the instructions.   The patient was advised to call back or seek an in-person evaluation if the symptoms worsen or if the condition fails to improve as anticipated.  The above assessment and management plan was discussed with the patient. The patient verbalized understanding of and has agreed to the management plan. Patient is aware to call the clinic if symptoms persist or worsen. Patient is aware when to return to the clinic for a follow-up visit. Patient educated on when it is appropriate to go to the emergency department.   Time call ended: 10:01 AM  I provided 11 minutes of non-face-to-face time during this encounter.  Deliah Boston, MSN, APRN, FNP-C  [facility] 06/13/20

## 2020-07-17 DIAGNOSIS — H9209 Otalgia, unspecified ear: Secondary | ICD-10-CM | POA: Diagnosis not present

## 2020-07-17 DIAGNOSIS — R059 Cough, unspecified: Secondary | ICD-10-CM | POA: Diagnosis not present

## 2020-07-17 DIAGNOSIS — R0602 Shortness of breath: Secondary | ICD-10-CM | POA: Diagnosis not present

## 2020-07-17 DIAGNOSIS — R0989 Other specified symptoms and signs involving the circulatory and respiratory systems: Secondary | ICD-10-CM | POA: Diagnosis not present

## 2020-09-19 ENCOUNTER — Telehealth: Payer: Self-pay | Admitting: Family

## 2020-09-19 NOTE — Telephone Encounter (Signed)
I am fine with the change as well.

## 2020-09-19 NOTE — Telephone Encounter (Signed)
This is fine with me   

## 2020-09-19 NOTE — Telephone Encounter (Signed)
Pt would like to switch PCP from [facility] to [facility]. Once switched she would like an appt to discuss weight loss.

## 2020-09-19 NOTE — Telephone Encounter (Signed)
Left message to call back. Patient can switch pcp both providers okay with it

## 2020-10-14 ENCOUNTER — Ambulatory Visit: Payer: Medicaid Other | Admitting: Family

## 2020-10-19 ENCOUNTER — Ambulatory Visit: Payer: Medicaid Other | Admitting: Family Medicine

## 2020-10-21 ENCOUNTER — Encounter: Payer: Self-pay | Admitting: Family Medicine

## 2020-11-16 ENCOUNTER — Ambulatory Visit: Payer: Medicaid Other | Admitting: Family Medicine

## 2020-12-21 ENCOUNTER — Ambulatory Visit: Payer: Medicaid Other | Admitting: Family Medicine

## 2020-12-22 ENCOUNTER — Encounter: Payer: Self-pay | Admitting: Family Medicine

## 2021-01-16 ENCOUNTER — Encounter: Payer: Self-pay | Admitting: Family Medicine

## 2021-01-16 ENCOUNTER — Other Ambulatory Visit: Payer: Self-pay | Admitting: Family Medicine

## 2021-01-16 ENCOUNTER — Other Ambulatory Visit: Payer: Self-pay

## 2021-01-16 ENCOUNTER — Ambulatory Visit: Payer: Medicaid Other | Admitting: Family Medicine

## 2021-01-16 DIAGNOSIS — M6289 Other specified disorders of muscle: Secondary | ICD-10-CM | POA: Diagnosis not present

## 2021-01-16 DIAGNOSIS — Z6841 Body Mass Index (BMI) 40.0 and over, adult: Secondary | ICD-10-CM

## 2021-01-16 MED ORDER — SEMAGLUTIDE-WEIGHT MANAGEMENT 1.7 MG/0.75ML ~~LOC~~ SOAJ: 1.7000 mg | SUBCUTANEOUS | 0 refills | Status: DC

## 2021-01-16 MED ORDER — SEMAGLUTIDE-WEIGHT MANAGEMENT 1 MG/0.5ML ~~LOC~~ SOAJ: 1.0000 mg | SUBCUTANEOUS | 0 refills | Status: AC

## 2021-01-16 MED ORDER — SEMAGLUTIDE-WEIGHT MANAGEMENT 0.25 MG/0.5ML ~~LOC~~ SOAJ: 0.2500 mg | SUBCUTANEOUS | 0 refills | Status: AC

## 2021-01-16 MED ORDER — SEMAGLUTIDE-WEIGHT MANAGEMENT 0.5 MG/0.5ML ~~LOC~~ SOAJ: 0.5000 mg | SUBCUTANEOUS | 0 refills | Status: AC

## 2021-01-16 MED ORDER — SEMAGLUTIDE-WEIGHT MANAGEMENT 2.4 MG/0.75ML ~~LOC~~ SOAJ: 2.4000 mg | SUBCUTANEOUS | 0 refills | Status: DC

## 2021-01-16 MED ORDER — SAXENDA 18 MG/3ML ~~LOC~~ SOPN: PEN_INJECTOR | SUBCUTANEOUS | 0 refills | Status: DC

## 2021-01-16 NOTE — Progress Notes (Signed)
BP 121/77   Pulse 99   Ht 5' 2"  (1.575 m)   Wt 265 lb (120.2 kg)   SpO2 93%   BMI 48.47 kg/m    Subjective:   Patient ID: Kristin Morgan, female    DOB: 1995-06-07, 25 y.o.   MRN: [id number]  HPI: Kristin Morgan is a 25 y.o. female presenting on 01/16/2021 for weight loss, Obesity (Would like to lose weight. Would like to try something simple to help), and Urinary Incontinence (With sneezing, coughing, movement)   HPI Obesity and weight Patient is coming in to discuss obesity and weight gain and how she wants to lose weight but she is having trouble.  She does admit that she had 2-3 sodas a day and has cookies and sweets and snacks throughout the day.  She says not eating that much meals but then she does admit to having snacks frequently and bread products. Patients joint pains are more complicated by the patient's morbid obesity.  Discussed weight loss and lifestyle modification and exercise with the patient.   Relevant past medical, surgical, family and social history reviewed and updated as indicated. Interim medical history since our last visit reviewed. Allergies and medications reviewed and updated.  Review of Systems  Constitutional:  Negative for chills and fever.  Eyes:  Negative for visual disturbance.  Respiratory:  Negative for chest tightness and shortness of breath.   Cardiovascular:  Negative for chest pain and leg swelling.  Musculoskeletal:  Positive for arthralgias.  Skin:  Negative for rash.  Neurological:  Negative for light-headedness and headaches.  Psychiatric/Behavioral:  Negative for agitation, behavioral problems, sleep disturbance and suicidal ideas. The patient is not nervous/anxious.   All other systems reviewed and are negative.  Per HPI unless specifically indicated above   Allergies as of 01/16/2021       Reactions   Aspirin Nausea And Vomiting   Sulfa Antibiotics Hives        Medication List        Accurate as of January 16, 2021 12:02  PM. If you have any questions, ask your nurse or doctor.          STOP taking these medications    amLODipine 5 MG tablet Commonly known as: NORVASC Stopped by: Worthy Rancher, MD   busPIRone 10 MG tablet Commonly known as: BUSPAR Stopped by: Fransisca Kaufmann Lacee Grey, MD   predniSONE 10 MG (21) Tbpk tablet Commonly known as: STERAPRED UNI-PAK 21 TAB Stopped by: Worthy Rancher, MD   prenatal multivitamin Tabs tablet Stopped by: Fransisca Kaufmann Quina Wilbourne, MD       TAKE these medications    levonorgestrel 20.1 MCG/DAY Iud Commonly known as: LILETTA 1 each by Intrauterine route once.   Saxenda 18 MG/3ML Sopn Generic drug: Liraglutide -Weight Management Inject 0.6 mg into the skin daily in the afternoon for 7 days, THEN 1.2 mg daily in the afternoon for 7 days, THEN 1.8 mg daily in the afternoon for 7 days, THEN 2.4 mg daily in the afternoon for 7 days, THEN 3 mg daily in the afternoon. Start taking on: January 16, 2021 Started by: Fransisca Kaufmann Deaven Urwin, MD   triamcinolone ointment 0.5 % Commonly known as: KENALOG Apply 1 application topically 2 (two) times daily.         Objective:   BP 121/77   Pulse 99   Ht 5' 2"  (1.575 m)   Wt 265 lb (120.2 kg)   SpO2 93%   BMI 48.47 kg/m  Wt Readings from Last 3 Encounters:  01/16/21 265 lb (120.2 kg)  03/07/20 223 lb (101.2 kg)  02/29/20 224 lb 3.2 oz (101.7 kg)    Physical Exam Vitals and nursing note reviewed.  Constitutional:      General: She is not in acute distress.    Appearance: She is well-developed. She is not diaphoretic.  Eyes:     Conjunctiva/sclera: Conjunctivae normal.  Skin:    General: Skin is warm and dry.     Findings: No rash.  Neurological:     Mental Status: She is alert and oriented to person, place, and time.     Coordination: Coordination normal.  Psychiatric:        Behavior: Behavior normal.      Assessment & Plan:   Problem List Items Addressed This Visit       Other   Morbid  obesity with BMI of 40.0-44.9, adult ([place])   Relevant Medications   Liraglutide -Weight Management (SAXENDA) 18 MG/3ML SOPN   Other Relevant Orders   CBC with Differential/Platelet   CMP14+EGFR   Lipid panel   Thyroid Panel With TSH   Other Visit Diagnoses     Morbid obesity ([place])    -  Primary   Relevant Medications   Liraglutide -Weight Management (SAXENDA) 18 MG/3ML SOPN   Other Relevant Orders   CBC with Differential/Platelet   CMP14+EGFR   Lipid panel   Thyroid Panel With TSH   Pelvic floor dysfunction in female           Gave handout on pelvic floor dysfunction training  Will check blood work. Follow up plan: Return if symptoms worsen or fail to improve, for 4 to 5-week weight recheck.  Counseling provided for all of the vaccine components Orders Placed This Encounter  Procedures   CBC with Differential/Platelet   CMP14+EGFR   Lipid panel   Thyroid Panel With TSH    Caryl Pina, MD [facility] 01/16/2021, 12:02 PM

## 2021-01-18 ENCOUNTER — Encounter: Payer: Self-pay | Admitting: Family Medicine

## 2021-03-04 DIAGNOSIS — R109 Unspecified abdominal pain: Secondary | ICD-10-CM | POA: Diagnosis not present

## 2021-03-04 DIAGNOSIS — R197 Diarrhea, unspecified: Secondary | ICD-10-CM | POA: Diagnosis not present

## 2021-03-04 DIAGNOSIS — R52 Pain, unspecified: Secondary | ICD-10-CM | POA: Diagnosis not present

## 2021-03-04 DIAGNOSIS — J0100 Acute maxillary sinusitis, unspecified: Secondary | ICD-10-CM | POA: Diagnosis not present

## 2021-03-20 DIAGNOSIS — J329 Chronic sinusitis, unspecified: Secondary | ICD-10-CM | POA: Diagnosis not present

## 2021-03-20 DIAGNOSIS — R0989 Other specified symptoms and signs involving the circulatory and respiratory systems: Secondary | ICD-10-CM | POA: Diagnosis not present

## 2021-03-20 DIAGNOSIS — J029 Acute pharyngitis, unspecified: Secondary | ICD-10-CM | POA: Diagnosis not present

## 2021-03-20 DIAGNOSIS — R059 Cough, unspecified: Secondary | ICD-10-CM | POA: Diagnosis not present

## 2021-04-22 ENCOUNTER — Encounter (HOSPITAL_COMMUNITY)
Admission: EM | Disposition: A | Discharge status: Home / Self Care | Payer: Self-pay | Source: Home / Self Care | Attending: Emergency Medicine

## 2021-04-22 ENCOUNTER — Emergency Department (HOSPITAL_COMMUNITY): Payer: Medicaid Other

## 2021-04-22 ENCOUNTER — Ambulatory Visit (HOSPITAL_COMMUNITY)
Admission: EM | Admit: 2021-04-22 | Discharge: 2021-04-22 | Disposition: A | Discharge status: Home / Self Care | Payer: Medicaid Other | Attending: General Surgery | Admitting: General Surgery

## 2021-04-22 ENCOUNTER — Emergency Department (HOSPITAL_COMMUNITY): Payer: Medicaid Other | Admitting: Anesthesiology

## 2021-04-22 ENCOUNTER — Encounter (HOSPITAL_COMMUNITY): Payer: Self-pay

## 2021-04-22 ENCOUNTER — Other Ambulatory Visit: Payer: Self-pay

## 2021-04-22 DIAGNOSIS — K3589 Other acute appendicitis without perforation or gangrene: Secondary | ICD-10-CM | POA: Insufficient documentation

## 2021-04-22 DIAGNOSIS — Z20822 Contact with and (suspected) exposure to covid-19: Secondary | ICD-10-CM | POA: Diagnosis not present

## 2021-04-22 DIAGNOSIS — R112 Nausea with vomiting, unspecified: Secondary | ICD-10-CM | POA: Diagnosis not present

## 2021-04-22 DIAGNOSIS — K353 Acute appendicitis with localized peritonitis, without perforation or gangrene: Secondary | ICD-10-CM | POA: Diagnosis present

## 2021-04-22 DIAGNOSIS — Z6841 Body Mass Index (BMI) 40.0 and over, adult: Secondary | ICD-10-CM | POA: Diagnosis not present

## 2021-04-22 DIAGNOSIS — R1110 Vomiting, unspecified: Secondary | ICD-10-CM | POA: Diagnosis present

## 2021-04-22 DIAGNOSIS — F32A Depression, unspecified: Secondary | ICD-10-CM | POA: Diagnosis not present

## 2021-04-22 DIAGNOSIS — I10 Essential (primary) hypertension: Secondary | ICD-10-CM | POA: Insufficient documentation

## 2021-04-22 DIAGNOSIS — K3580 Unspecified acute appendicitis: Secondary | ICD-10-CM | POA: Diagnosis not present

## 2021-04-22 DIAGNOSIS — K5730 Diverticulosis of large intestine without perforation or abscess without bleeding: Secondary | ICD-10-CM | POA: Diagnosis not present

## 2021-04-22 HISTORY — PX: LAPAROSCOPIC APPENDECTOMY: SHX408

## 2021-04-22 LAB — CBC WITH DIFFERENTIAL/PLATELET
Abs Immature Granulocytes: 0.05 10*3/uL (ref 0.00–0.07)
Basophils Absolute: 0.1 10*3/uL (ref 0.0–0.1)
Basophils Relative: 0 %
Eosinophils Absolute: 0 10*3/uL (ref 0.0–0.5)
Eosinophils Relative: 0 %
HCT: 41.4 % (ref 36.0–46.0)
Hemoglobin: 13.5 g/dL (ref 12.0–15.0)
Immature Granulocytes: 0 %
Lymphocytes Relative: 11 %
Lymphs Abs: 1.7 10*3/uL (ref 0.7–4.0)
MCH: 24.7 pg — ABNORMAL LOW (ref 26.0–34.0)
MCHC: 32.6 g/dL (ref 30.0–36.0)
MCV: 75.7 fL — ABNORMAL LOW (ref 80.0–100.0)
Monocytes Absolute: 0.8 10*3/uL (ref 0.1–1.0)
Monocytes Relative: 5 %
Neutro Abs: 12.9 10*3/uL — ABNORMAL HIGH (ref 1.7–7.7)
Neutrophils Relative %: 84 %
Platelets: 374 10*3/uL (ref 150–400)
RBC: 5.47 MIL/uL — ABNORMAL HIGH (ref 3.87–5.11)
RDW: 14.6 % (ref 11.5–15.5)
WBC: 15.5 10*3/uL — ABNORMAL HIGH (ref 4.0–10.5)
nRBC: 0 % (ref 0.0–0.2)

## 2021-04-22 LAB — URINALYSIS, ROUTINE W REFLEX MICROSCOPIC
Bilirubin Urine: NEGATIVE
Glucose, UA: NEGATIVE mg/dL
Ketones, ur: NEGATIVE mg/dL
Leukocytes,Ua: NEGATIVE
Nitrite: NEGATIVE
Protein, ur: 30 mg/dL — AB
Specific Gravity, Urine: 1.025 (ref 1.005–1.030)
pH: 6 (ref 5.0–8.0)

## 2021-04-22 LAB — PREGNANCY, URINE: Preg Test, Ur: NEGATIVE

## 2021-04-22 LAB — RESP PANEL BY RT-PCR (FLU A&B, COVID) ARPGX2
Influenza A by PCR: NEGATIVE
Influenza B by PCR: NEGATIVE
SARS Coronavirus 2 by RT PCR: NEGATIVE

## 2021-04-22 LAB — COMPREHENSIVE METABOLIC PANEL
ALT: 17 U/L (ref 0–44)
AST: 17 U/L (ref 15–41)
Albumin: 4.4 g/dL (ref 3.5–5.0)
Alkaline Phosphatase: 80 U/L (ref 38–126)
Anion gap: 11 (ref 5–15)
BUN: 12 mg/dL (ref 6–20)
CO2: 22 mmol/L (ref 22–32)
Calcium: 9 mg/dL (ref 8.9–10.3)
Chloride: 100 mmol/L (ref 98–111)
Creatinine, Ser: 0.68 mg/dL (ref 0.44–1.00)
GFR, Estimated: >60 mL/min (ref >60)
Glucose, Bld: 108 mg/dL — ABNORMAL HIGH (ref 70–99)
Potassium: 3.7 mmol/L (ref 3.5–5.1)
Sodium: 133 mmol/L — ABNORMAL LOW (ref 135–145)
Total Bilirubin: 0.4 mg/dL (ref 0.3–1.2)
Total Protein: 7.9 g/dL (ref 6.5–8.1)

## 2021-04-22 LAB — URINALYSIS, MICROSCOPIC (REFLEX): Squamous Epithelial / HPF: >50 (ref 0–5)

## 2021-04-22 LAB — LIPASE, BLOOD: Lipase: 25 U/L (ref 11–51)

## 2021-04-22 SURGERY — APPENDECTOMY, LAPAROSCOPIC
Anesthesia: General

## 2021-04-22 MED ORDER — HYDROMORPHONE HCL 1 MG/ML IJ SOLN
1.0000 mg | Freq: Once | INTRAMUSCULAR | Status: AC
  Administered 2021-04-22: 1 mg via INTRAVENOUS
  Filled 2021-04-22: qty 1

## 2021-04-22 MED ORDER — METRONIDAZOLE 500 MG/100ML IV SOLN
500.0000 mg | Freq: Once | INTRAVENOUS | Status: AC
  Administered 2021-04-22: 500 mg via INTRAVENOUS
  Filled 2021-04-22: qty 100

## 2021-04-22 MED ORDER — FENTANYL CITRATE (PF) 100 MCG/2ML IJ SOLN
INTRAMUSCULAR | Status: DC | PRN
Start: 2021-04-22 — End: 2021-04-22
  Administered 2021-04-22: 100 ug via INTRAVENOUS

## 2021-04-22 MED ORDER — ROCURONIUM BROMIDE 10 MG/ML (PF) SYRINGE
PREFILLED_SYRINGE | INTRAVENOUS | Status: AC
  Filled 2021-04-22: qty 10

## 2021-04-22 MED ORDER — CEFOTETAN DISODIUM 2 G IJ SOLR
INTRAMUSCULAR | Status: DC | PRN
  Administered 2021-04-22: 2 g via INTRAVENOUS

## 2021-04-22 MED ORDER — MIDAZOLAM HCL 2 MG/2ML IJ SOLN
INTRAMUSCULAR | Status: AC
  Filled 2021-04-22: qty 2

## 2021-04-22 MED ORDER — ONDANSETRON HCL 4 MG/2ML IJ SOLN
4.0000 mg | Freq: Once | INTRAMUSCULAR | Status: AC
  Administered 2021-04-22: 4 mg via INTRAVENOUS
  Filled 2021-04-22: qty 2

## 2021-04-22 MED ORDER — ONDANSETRON HCL 4 MG/2ML IJ SOLN
INTRAMUSCULAR | Status: AC
  Filled 2021-04-22: qty 2

## 2021-04-22 MED ORDER — SUGAMMADEX SODIUM 200 MG/2ML IV SOLN
INTRAVENOUS | Status: DC | PRN
Start: 2021-04-22 — End: 2021-04-22
  Administered 2021-04-22: 200 mg via INTRAVENOUS

## 2021-04-22 MED ORDER — FENTANYL CITRATE PF 50 MCG/ML IJ SOSY: 25.0000 ug | PREFILLED_SYRINGE | INTRAMUSCULAR | Status: DC | PRN

## 2021-04-22 MED ORDER — OXYCODONE HCL 5 MG PO TABS: 5.0000 mg | ORAL_TABLET | ORAL | 0 refills | Status: DC | PRN

## 2021-04-22 MED ORDER — LACTATED RINGERS IV SOLN: INTRAVENOUS | Status: DC | PRN

## 2021-04-22 MED ORDER — SODIUM CHLORIDE 0.9 % IR SOLN
Status: DC | PRN
  Administered 2021-04-22: 1000 mL

## 2021-04-22 MED ORDER — PROPOFOL 10 MG/ML IV BOLUS
INTRAVENOUS | Status: DC | PRN
  Administered 2021-04-22: 200 mg via INTRAVENOUS

## 2021-04-22 MED ORDER — SUCCINYLCHOLINE CHLORIDE 200 MG/10ML IV SOSY
PREFILLED_SYRINGE | INTRAVENOUS | Status: AC
  Filled 2021-04-22: qty 10

## 2021-04-22 MED ORDER — SODIUM CHLORIDE 0.9 % IV BOLUS
1000.0000 mL | Freq: Once | INTRAVENOUS | Status: AC
  Administered 2021-04-22: 1000 mL via INTRAVENOUS

## 2021-04-22 MED ORDER — KETOROLAC TROMETHAMINE 30 MG/ML IJ SOLN
30.0000 mg | Freq: Once | INTRAMUSCULAR | Status: AC
  Administered 2021-04-22: 30 mg via INTRAVENOUS

## 2021-04-22 MED ORDER — KETOROLAC TROMETHAMINE 30 MG/ML IJ SOLN
INTRAMUSCULAR | Status: AC
  Filled 2021-04-22: qty 1

## 2021-04-22 MED ORDER — SODIUM CHLORIDE 0.9 % IV SOLN
2.0000 g | Freq: Once | INTRAVENOUS | Status: AC
  Administered 2021-04-22: 2 g via INTRAVENOUS
  Filled 2021-04-22: qty 20

## 2021-04-22 MED ORDER — PROPOFOL 10 MG/ML IV BOLUS
INTRAVENOUS | Status: AC
  Filled 2021-04-22: qty 20

## 2021-04-22 MED ORDER — MIDAZOLAM HCL 5 MG/5ML IJ SOLN
INTRAMUSCULAR | Status: DC | PRN
  Administered 2021-04-22: 2 mg via INTRAVENOUS

## 2021-04-22 MED ORDER — ROCURONIUM BROMIDE 100 MG/10ML IV SOLN
INTRAVENOUS | Status: DC | PRN
  Administered 2021-04-22: 50 mg via INTRAVENOUS

## 2021-04-22 MED ORDER — ONDANSETRON HCL 4 MG/2ML IJ SOLN: 4.0000 mg | Freq: Once | INTRAMUSCULAR | Status: DC | PRN

## 2021-04-22 MED ORDER — SODIUM CHLORIDE 0.9 % IV SOLN
INTRAVENOUS | Status: AC
  Filled 2021-04-22: qty 2

## 2021-04-22 MED ORDER — FENTANYL CITRATE (PF) 100 MCG/2ML IJ SOLN
INTRAMUSCULAR | Status: AC
  Filled 2021-04-22: qty 2

## 2021-04-22 MED ORDER — ONDANSETRON 8 MG PO TBDP: 8.0000 mg | ORAL_TABLET | Freq: Three times a day (TID) | ORAL | 0 refills | Status: DC | PRN

## 2021-04-22 MED ORDER — BUPIVACAINE LIPOSOME 1.3 % IJ SUSP
INTRAMUSCULAR | Status: DC | PRN
  Administered 2021-04-22: 20 mL

## 2021-04-22 MED ORDER — METOCLOPRAMIDE HCL 5 MG/ML IJ SOLN
10.0000 mg | Freq: Once | INTRAMUSCULAR | Status: AC
  Administered 2021-04-22: 10 mg via INTRAVENOUS
  Filled 2021-04-22: qty 2

## 2021-04-22 MED ORDER — SUCCINYLCHOLINE CHLORIDE 200 MG/10ML IV SOSY
PREFILLED_SYRINGE | INTRAVENOUS | Status: DC | PRN
  Administered 2021-04-22: 100 mg via INTRAVENOUS

## 2021-04-22 MED ORDER — ONDANSETRON HCL 4 MG/2ML IJ SOLN
INTRAMUSCULAR | Status: DC | PRN
Start: 2021-04-22 — End: 2021-04-22
  Administered 2021-04-22: 4 mg via INTRAVENOUS

## 2021-04-22 MED ORDER — IOHEXOL 300 MG/ML  SOLN
100.0000 mL | Freq: Once | INTRAMUSCULAR | Status: AC | PRN
  Administered 2021-04-22: 100 mL via INTRAVENOUS

## 2021-04-22 SURGICAL SUPPLY — 47 items
ADH SKN CLS APL DERMABOND .7 (GAUZE/BANDAGES/DRESSINGS) ×1
APL PRP STRL LF DISP 70% ISPRP (MISCELLANEOUS) ×1
BAG RETRIEVAL 10 (BASKET) ×1
BLADE SURG 15 STRL LF DISP TIS (BLADE) ×1 IMPLANT
BLADE SURG 15 STRL SS (BLADE) ×2
CHLORAPREP W/TINT 26 (MISCELLANEOUS) ×2 IMPLANT
CLOTH BEACON ORANGE TIMEOUT ST (SAFETY) ×2 IMPLANT
COVER LIGHT HANDLE STERIS (MISCELLANEOUS) ×4 IMPLANT
CUTTER FLEX LINEAR 45M (STAPLE) ×2 IMPLANT
DERMABOND ADVANCED (GAUZE/BANDAGES/DRESSINGS) ×1
DERMABOND ADVANCED .7 DNX12 (GAUZE/BANDAGES/DRESSINGS) ×1 IMPLANT
ELECT REM PT RETURN 9FT ADLT (ELECTROSURGICAL) ×2
ELECTRODE REM PT RTRN 9FT ADLT (ELECTROSURGICAL) ×1 IMPLANT
GAUZE 4X4 16PLY ~~LOC~~+RFID DBL (SPONGE) ×2 IMPLANT
GLOVE BIO SURGEON STRL SZ7 (GLOVE) ×1 IMPLANT
GLOVE SURG ENC MOIS LTX SZ6.5 (GLOVE) ×3 IMPLANT
GLOVE SURG UNDER POLY LF SZ7 (GLOVE) ×8 IMPLANT
GOWN STRL REUS W/TWL LRG LVL3 (GOWN DISPOSABLE) ×4 IMPLANT
INST SET LAPROSCOPIC AP (KITS) ×2 IMPLANT
KIT TURNOVER KIT A (KITS) ×2 IMPLANT
MANIFOLD NEPTUNE II (INSTRUMENTS) ×2 IMPLANT
NDL HYPO 18GX1.5 BLUNT FILL (NEEDLE) ×1 IMPLANT
NDL HYPO 21X1.5 SAFETY (NEEDLE) ×1 IMPLANT
NDL INSUFFLATION 14GA 120MM (NEEDLE) ×1 IMPLANT
NEEDLE HYPO 18GX1.5 BLUNT FILL (NEEDLE) ×2 IMPLANT
NEEDLE HYPO 21X1.5 SAFETY (NEEDLE) ×2 IMPLANT
NEEDLE INSUFFLATION 14GA 120MM (NEEDLE) ×2 IMPLANT
NS IRRIG 1000ML POUR BTL (IV SOLUTION) ×2 IMPLANT
PACK LAP CHOLE LZT030E (CUSTOM PROCEDURE TRAY) ×2 IMPLANT
PAD ARMBOARD 7.5X6 YLW CONV (MISCELLANEOUS) ×2 IMPLANT
RELOAD 45 VASCULAR/THIN (ENDOMECHANICALS) ×2 IMPLANT
RELOAD STAPLE 45 2.5 WHT GRN (ENDOMECHANICALS) IMPLANT
SET BASIN LINEN APH (SET/KITS/TRAYS/PACK) ×2 IMPLANT
SET TUBE SMOKE EVAC HIGH FLOW (TUBING) ×2 IMPLANT
SHEARS HARMONIC ACE PLUS 36CM (ENDOMECHANICALS) ×2 IMPLANT
SUT MNCRL AB 4-0 PS2 18 (SUTURE) ×4 IMPLANT
SUT VICRYL 0 UR6 27IN ABS (SUTURE) ×2 IMPLANT
SYR 20ML LL LF (SYRINGE) ×4 IMPLANT
SYS BAG RETRIEVAL 10MM (BASKET) ×1
SYSTEM BAG RETRIEVAL 10MM (BASKET) ×1 IMPLANT
TRAY FOLEY W/BAG SLVR 16FR (SET/KITS/TRAYS/PACK) ×2
TRAY FOLEY W/BAG SLVR 16FR ST (SET/KITS/TRAYS/PACK) ×1 IMPLANT
TROCAR ENDO BLADELESS 11MM (ENDOMECHANICALS) ×2 IMPLANT
TROCAR ENDO BLADELESS 12MM (ENDOMECHANICALS) ×2 IMPLANT
TROCAR XCEL NON-BLD 5MMX100MML (ENDOMECHANICALS) ×2 IMPLANT
TUBE CONNECTING 12X1/4 (SUCTIONS) ×2 IMPLANT
WARMER LAPAROSCOPE (MISCELLANEOUS) ×2 IMPLANT

## 2021-04-22 NOTE — Anesthesia Preprocedure Evaluation (Signed)
Anesthesia Evaluation  Patient identified by MRN, date of birth, ID band Patient awake    Reviewed: Allergy & Precautions, H&P , NPO status , Patient's Chart, lab work & pertinent test results, reviewed documented beta blocker date and time   Airway Mallampati: II  TM Distance: >3 FB Neck ROM: full    Dental no notable dental hx.    Pulmonary neg pulmonary ROS,    Pulmonary exam normal breath sounds clear to auscultation       Cardiovascular Exercise Tolerance: Good hypertension,  Rhythm:regular Rate:Normal     Neuro/Psych PSYCHIATRIC DISORDERS Depression negative neurological ROS     GI/Hepatic negative GI ROS, Neg liver ROS,   Endo/Other  Morbid obesity  Renal/GU negative Renal ROS  negative genitourinary   Musculoskeletal   Abdominal   Peds  Hematology negative hematology ROS (+)   Anesthesia Other Findings   Reproductive/Obstetrics negative OB ROS                             Anesthesia Physical Anesthesia Plan  ASA: 3 and emergent  Anesthesia Plan: General and General ETT   Post-op Pain Management:    Induction:   PONV Risk Score and Plan: Ondansetron  Airway Management Planned:   Additional Equipment:   Intra-op Plan:   Post-operative Plan:   Informed Consent: I have reviewed the patients History and Physical, chart, labs and discussed the procedure including the risks, benefits and alternatives for the proposed anesthesia with the patient or authorized representative who has indicated his/her understanding and acceptance.     Dental Advisory Given  Plan Discussed with: CRNA  Anesthesia Plan Comments:         Anesthesia Quick Evaluation

## 2021-04-22 NOTE — Op Note (Signed)
[facility]  Date of Surgery: 04/22/2021  Admit Date: 04/22/2021   Performing Service: General  Surgeon(s) and Role:    * Lucretia Roers, MD - Primary   Pre-operative Diagnosis: Acute Appendicitis  Post-operative Diagnosis: Acute Appendicitis  Procedure Performed: Laparoscopic Appendectomy   Surgeon: Leatrice Jewels. Henreitta Leber, MD   Assistant: No qualified resident was available.   Anesthesia: General   Findings:  The appendix was found to be inflamed. There were not signs of necrosis. There was not perforation. There was not abscess formation.   Estimated Blood Loss: Minimal   Specimens: Appendix   Complications: None; patient tolerated the procedure well.   Disposition: PACU - hemodynamically stable.   Condition: stable   Indications: The patient presented with a 2 day history of right-sided abdominal pain. A CT revealed findings consistent with acute appendicitis.   Procedure Details  Prior to the procedure, the risks, benefits, complications, treatment options, and expected outcomes were discussed with the patient and/or family, including but not limited to the risk of bleeding, infection, finding of a normal appendix, and the need for conversion to an open procedure. There was concurrence with the proposed plan and informed consent was obtained. The patient was taken to the operating room, identified as Kristin Morgan and the procedure verified as Laproscopic Appendectomy.    The patient was placed in the supine position and general anesthesia was induced, along with placement of orogastric tube, SCD's, and a Foley catheter. The abdomen was prepped and draped in a sterile fashion. The abdomen was entered with Veress technique in the infraumbilical incision. Intraperitoneal placement was confirmed with saline drop, low entry pressures, and easy insufflation. A 11 mm optiview trocar was placed under direct visualization with a 0 degree scope. The 10 mm 0 degree  scope was placed in the abdomen and no evidence of injury was identified. A 12 mm port was placed in the left lower quadrant of the abdomen after skin incision with trocar placement under direct vision. A careful evaluation of the entire abdomen was carried out. An additional 5 mm port was placed in the suprapubic area under direct vision.  The patient was placed in Trendelenburg and left lateral decubitus position. The small intestines were retracted in the cephalad and left lateral direction away from the pelvis and right lower quadrant. The patient was found to have an dilated inflamed appendix. There was not evidence of perforation.   The appendix was carefully dissected. A window was made in the mesoappendix at the base of the appendix. The appendix was divided at its base using a standard endo-GIA stapler. Minimal appendiceal stump was left in place. The mesoappendix was taken with the harmonic energy device. The appendix was placed within an Endocatch specimen bag. There was no evidence of bleeding, leakage, or complication after division of the appendix.  Any remaining blood or pus was suctioned out from the abdomen, hemostasis was confirmed. The endocatch bag was removed via the 12 mm port, then the abdomen desufflated. The appendix was passed off the field as a specimen.   The the 12 mm and 10 mm port sites were smaller than my finger tip and tangential. The trocar site skin wounds were closed using subcuticular 4-0 Monocryl suture and dermabond. The patient was then awakened from general anesthesia, extubated, and taken to PACU for recovery.   Instrument, sponge, and needle counts were correct at the conclusion of the case.   Kristin Greenhouse, MD [facility] [address] [place]  [place], [place] [address] [id number])

## 2021-04-22 NOTE — ED Triage Notes (Signed)
Pt arrived via POV c/o multiple episodes of emesis and epigastric abdominal pain. Pt reports pain began suddenly and denies injury. Pt reports no relief with OTC medications.

## 2021-04-22 NOTE — H&P (Signed)
[facility] History and Physical  Reason for Referral: Acute appendicitis  Referring Physician:  ED Dr. Criss Alvine   Chief Complaint   Emesis     Kristin Morgan is a 26 y.o. female.  HPI: Kristin Morgan is a 26 yo with no major medical issues who comes in with a history of abdominal pain that is sharp on the right side since Thursday. She has been having nausea and vomiting and has not really kept anything down. She says that her pain is worse than it was previously. She had a CT that demonstrated appendicitis without complication. She has never had surgery.   She has not taken the Semaglutide injection.   Past Medical History:  Diagnosis Date   Depression    Pregnant     Past Surgical History:  Procedure Laterality Date   WISDOM TOOTH EXTRACTION  12/2018    Family History  Problem Relation Age of Onset   Hypertension Mother    Mental illness Mother    Hypertension Maternal Grandmother     Social History   Tobacco Use   Smoking status: Never   Smokeless tobacco: Never  Vaping Use   Vaping Use: Never used  Substance Use Topics   Alcohol use: Not Currently   Drug use: No    Medications: I have reviewed the patient's current medications. Current Facility-Administered Medications  Medication Dose Route Frequency Provider Last Rate Last Admin   cefTRIAXone (ROCEPHIN) 2 g in sodium chloride 0.9 % 100 mL IVPB  2 g Intravenous Once Pricilla Loveless, MD 200 mL/hr at 04/22/21 0943 2 g at 04/22/21 4259   And   metroNIDAZOLE (FLAGYL) IVPB 500 mg  500 mg Intravenous Once Pricilla Loveless, MD       Current Outpatient Medications  Medication Sig Dispense Refill Last Dose   levonorgestrel (LILETTA) 20.1 MCG/DAY IUD 1 each by Intrauterine route once.   implant   ondansetron (ZOFRAN-ODT) 8 MG disintegrating tablet Take 1 tablet (8 mg total) by mouth every 8 (eight) hours as needed for nausea or vomiting. 10 tablet 0    Semaglutide-Weight Management 1.7 MG/0.75ML SOAJ Inject  1.7 mg into the skin once a week for 28 days. (Patient not taking: Reported on 04/22/2021) 3 mL 0 Not Taking   [START ON 05/12/2021] Semaglutide-Weight Management 2.4 MG/0.75ML SOAJ Inject 2.4 mg into the skin once a week for 28 days. (Patient not taking: Reported on 04/22/2021) 3 mL 0 Not Taking   triamcinolone ointment (KENALOG) 0.5 % Apply 1 application topically 2 (two) times daily. (Patient not taking: Reported on 04/22/2021) 60 g 2 Not Taking    Allergies  Allergen Reactions   Aspirin Nausea And Vomiting   Sulfa Antibiotics Hives      ROS:  A comprehensive review of systems was negative except for: Gastrointestinal: positive for abdominal pain, nausea, and vomiting  Blood pressure 128/83, pulse 82, temperature 98.1 F (36.7 C), temperature source Oral, resp. rate 16, height 5\' 2"  (1.575 m), weight 120 kg, last menstrual period 04/11/2021, SpO2 93 %, unknown if currently breastfeeding. Physical Exam Vitals reviewed.  Constitutional:      Appearance: Normal appearance.  HENT:     Head: Normocephalic.     Nose: Nose normal.  Eyes:     Extraocular Movements: Extraocular movements intact.  Cardiovascular:     Rate and Rhythm: Normal rate.  Pulmonary:     Effort: Pulmonary effort is normal.  Abdominal:     General: There is no distension.  Palpations: Abdomen is soft.     Tenderness: There is abdominal tenderness in the right lower quadrant and suprapubic area.  Musculoskeletal:     Comments: Moves all extremities   Skin:    General: Skin is warm.  Neurological:     General: No focal deficit present.     Mental Status: She is alert and oriented to person, place, and time.  Psychiatric:        Mood and Affect: Mood normal.        Behavior: Behavior normal.        Thought Content: Thought content normal.        Judgment: Judgment normal.    Results: Results for orders placed or performed during the hospital encounter of 04/22/21 (from the past 48 hour(s))  Urinalysis,  Routine w reflex microscopic Urine, Clean Catch     Status: Abnormal   Collection Time: 04/22/21  6:22 AM  Result Value Ref Range   Color, Urine YELLOW YELLOW   APPearance CLEAR CLEAR   Specific Gravity, Urine 1.025 1.005 - 1.030   pH 6.0 5.0 - 8.0   Glucose, UA NEGATIVE NEGATIVE mg/dL   Hgb urine dipstick TRACE (A) NEGATIVE   Bilirubin Urine NEGATIVE NEGATIVE   Ketones, ur NEGATIVE NEGATIVE mg/dL   Protein, ur 30 (A) NEGATIVE mg/dL   Nitrite NEGATIVE NEGATIVE   Leukocytes,Ua NEGATIVE NEGATIVE    Comment: Performed at [facility], [address] [place], [place] [address]  Pregnancy, urine     Status: None   Collection Time: 04/22/21  6:22 AM  Result Value Ref Range   Preg Test, Ur NEGATIVE NEGATIVE    Comment:        THE SENSITIVITY OF THIS METHODOLOGY IS >20 mIU/mL. Performed at [facility], [address] [place], [place] [address]   Urinalysis, Microscopic (reflex)     Status: Abnormal   Collection Time: 04/22/21  6:22 AM  Result Value Ref Range   RBC / HPF 0-5 0 - 5 RBC/hpf   WBC, UA 0-5 0 - 5 WBC/hpf   Bacteria, UA FEW (A) NONE SEEN   Squamous Epithelial / LPF >50 0 - 5   Mucus PRESENT     Comment: Performed at [facility], [address] [place], [place] [address]  CBC with Differential     Status: Abnormal   Collection Time: 04/22/21  7:00 AM  Result Value Ref Range   WBC 15.5 (H) 4.0 - 10.5 K/uL   RBC 5.47 (H) 3.87 - 5.11 MIL/uL   Hemoglobin 13.5 12.0 - 15.0 g/dL   HCT 10.241.4 72.536.0 - 36.646.0 %   MCV 75.7 (L) 80.0 - 100.0 fL   MCH 24.7 (L) 26.0 - 34.0 pg   MCHC 32.6 30.0 - 36.0 g/dL   RDW 44.014.6 34.711.5 - 42.515.5 %   Platelets 374 150 - 400 K/uL   nRBC 0.0 0.0 - 0.2 %   Neutrophils Relative % 84 %   Neutro Abs 12.9 (H) 1.7 - 7.7 K/uL   Lymphocytes Relative 11 %   Lymphs Abs 1.7 0.7 - 4.0 K/uL   Monocytes Relative 5 %   Monocytes Absolute 0.8 0.1 - 1.0 K/uL   Eosinophils Relative 0 %   Eosinophils Absolute 0.0 0.0 - 0.5 K/uL   Basophils Relative 0 %   Basophils  Absolute 0.1 0.0 - 0.1 K/uL   Immature Granulocytes 0 %   Abs Immature Granulocytes 0.05 0.00 - 0.07 K/uL    Comment: Performed at [facility], [address] [place], [place] [address]  Comprehensive metabolic panel     Status: Abnormal   Collection Time: 04/22/21  7:00 AM  Result Value Ref Range   Sodium 133 (L) 135 - 145 mmol/L   Potassium 3.7 3.5 - 5.1 mmol/L   Chloride 100 98 - 111 mmol/L   CO2 22 22 - 32 mmol/L   Glucose, Bld 108 (H) 70 - 99 mg/dL    Comment: Glucose reference range applies only to samples taken after fasting for at least 8 hours.   BUN 12 6 - 20 mg/dL   Creatinine, Ser 6.040.68 0.44 - 1.00 mg/dL   Calcium 9.0 8.9 - 54.010.3 mg/dL   Total Protein 7.9 6.5 - 8.1 g/dL   Albumin 4.4 3.5 - 5.0 g/dL   AST 17 15 - 41 U/L   ALT 17 0 - 44 U/L   Alkaline Phosphatase 80 38 - 126 U/L   Total Bilirubin 0.4 0.3 - 1.2 mg/dL   GFR, Estimated >98>60 >11>60 mL/min    Comment: (NOTE) Calculated using the CKD-EPI Creatinine Equation (2021)    Anion gap 11 5 - 15    Comment: Performed at [facility], [address] [place], [place] [address]  Lipase, blood     Status: None   Collection Time: 04/22/21  7:00 AM  Result Value Ref Range   Lipase 25 11 - 51 U/L    Comment: Performed at [facility], [address] [place], [place] [address]   Personally reviewed and reviewed with patient- dilated appendix with some inflammation surrounding CT ABDOMEN PELVIS W CONTRAST  Result Date: 04/22/2021 CLINICAL DATA:  26 year old female with history of nausea and vomiting. EXAM: CT ABDOMEN AND PELVIS WITH CONTRAST TECHNIQUE: Multidetector CT imaging of the abdomen and pelvis was performed using the standard protocol following bolus administration of intravenous contrast. RADIATION DOSE REDUCTION: This exam was performed according to the departmental dose-optimization program which includes automated exposure control, adjustment of the mA and/or kV according to patient size and/or use of iterative  reconstruction technique. CONTRAST:  100mL OMNIPAQUE IOHEXOL 300 MG/ML  SOLN COMPARISON:  No priors. FINDINGS: Lower chest: Unremarkable. Hepatobiliary: No suspicious cystic or solid hepatic lesions. No intra or extrahepatic biliary ductal dilatation. Gallbladder is normal in appearance. Pancreas: No pancreatic mass. No pancreatic ductal dilatation. No pancreatic or peripancreatic fluid collections or inflammatory changes. Spleen: Unremarkable. Adrenals/Urinary Tract: Bilateral kidneys and bilateral adrenal glands are normal in appearance. No hydroureteronephrosis. Urinary bladder is normal in appearance. Stomach/Bowel: The appearance of the stomach is normal. There is no pathologic dilatation of small bowel or colon. A few scattered colonic diverticulae are noted, without surrounding inflammatory changes to suggest an acute diverticulitis at this time. The appendix is dilated and inflamed, indicative of an acute appendicitis. Appendix: Location: Inferior to the cecum Diameter: 11 mm Appendicolith: None Mucosal hyper-enhancement: Present Extraluminal gas: None Periappendiceal collection: Mild periappendiceal inflammatory changes in the adjacent fat, but no well organized fluid collection. Vascular/Lymphatic: No significant atherosclerotic disease, aneurysm or dissection noted in the abdominal or pelvic vasculature. No lymphadenopathy noted in the abdomen or pelvis. Reproductive: IUD present in the uterus. Uterus and ovaries are otherwise unremarkable in appearance. Other: No significant volume of ascites.  No pneumoperitoneum. Musculoskeletal: There are no aggressive appearing lytic or blastic lesions noted in the visualized portions of the skeleton. IMPRESSION: 1. Acute appendicitis, as above. Surgical consultation is recommended. 2. Mild colonic diverticulosis without evidence of acute diverticulitis at this time. Electronically Signed  By: Trudie Reed M.D.   On: 04/22/2021 09:07     Assessment & Plan:   Kristin Morgan is a 26 y.o. female with acute appendicitis. She is very nervous about surgery.  -Discussed the risk of laparoscopic appendectomy and the option of antibiotics alone. Discussed that in [place] and some trials in the [name], antibiotics are used for simple appendicitis. Discussed that research shows a 40% failure rate for antibiotics alone.  Discussed risk of surgery including but not limited to bleeding, infection, injury to other organs, normal appendix, and after this discussion the patient has decided to proceed with surgery.   All questions were answered to the satisfaction of the patient.  Receiving antibiotics now NPO OR consent signed    Kristin Morgan 04/22/2021, 9:28 AM

## 2021-04-22 NOTE — Anesthesia Postprocedure Evaluation (Signed)
Anesthesia Post Note  Patient: Kristin Morgan  Procedure(s) Performed: APPENDECTOMY LAPAROSCOPIC  Patient location during evaluation: PACU Anesthesia Type: General Level of consciousness: awake and alert Pain management: pain level controlled Vital Signs Assessment: post-procedure vital signs reviewed and stable Respiratory status: spontaneous breathing, nonlabored ventilation, respiratory function stable and patient connected to nasal cannula oxygen Cardiovascular status: blood pressure returned to baseline and stable Postop Assessment: no apparent nausea or vomiting Anesthetic complications: no   No notable events documented.   Last Vitals:  Vitals:   04/22/21 0750 04/22/21 0800  BP:  128/83  Pulse: 99 82  Resp:  16  Temp:    SpO2: 98% 93%    Last Pain:  Vitals:   04/22/21 0820  TempSrc:   PainSc: [place]

## 2021-04-22 NOTE — ED Provider Notes (Signed)
7:35 AM Care transferred to me. Patient has diffuse abdominal tenderness. Currently has severe pain.   8:04 AM Labs reviewed and interpreted by me.  Has a leukocytosis unclear if reactive to vomiting or due to infection.  Otherwise her labs are overall unremarkable.  This includes normal LFTs and lipase.  Given her abdominal pain was diffuse, will get a CT scan for further evaluation.  9:38 AM CT scan images personally reviewed/interpreted.  This shows a dilated and inflamed appendix consistent with appendicitis.  I agree with radiology.  Patient is clinically doing better.  I consulted and discussed with Dr. Henreitta Leber, and patient will need appendectomy.  We will give a dose of IV antibiotics.  She will be kept NPO.   Pricilla Loveless, MD 04/22/21 [phone]

## 2021-04-22 NOTE — Discharge Instructions (Addendum)
Discharge Laparoscopic Surgery Instructions:  Common Complaints: Right shoulder pain is common after laparoscopic surgery. This is secondary to the gas used in the surgery being trapped under the diaphragm.  Walk to help your body absorb the gas. This will improve in a few days. Pain at the port sites are common, especially the larger port sites. This will improve with time.  Some nausea is common and poor appetite. The main goal is to stay hydrated the first few days after surgery.   Diet/ Activity: Diet as tolerated. You may not have an appetite, but it is important to stay hydrated. Drink 64 ounces of water a day. Your appetite will return with time.  Shower per your regular routine daily.  Do not take hot showers. Take warm showers that are less than 10 minutes. Rest and listen to your body, but do not remain in bed all day.  Walk everyday for at least 15-20 minutes. Deep cough and move around every 1-2 hours in the first few days after surgery.  Do not lift > 10 lbs, perform excessive bending, pushing, pulling, squatting for 1-2 weeks after surgery.  Do not pick at the dermabond glue on your incision sites.  This glue film will remain in place for 1-2 weeks and will start to peel off.  Do not place lotions or balms on your incision unless instructed to specifically by Dr. Dalisa Forrer.   Pain Expectations and Narcotics: -After surgery you will have pain associated with your incisions and this is normal. The pain is muscular and nerve pain, and will get better with time. -You are encouraged and expected to take non narcotic medications like tylenol and ibuprofen (when able) to treat pain as multiple modalities can aid with pain treatment. -Narcotics are only used when pain is severe or there is breakthrough pain. -You are not expected to have a pain score of 0 after surgery, as we cannot prevent pain. A pain score of 3-4 that allows you to be functional, move, walk, and tolerate some activity is  the goal. The pain will continue to improve over the days after surgery and is dependent on your surgery. -Due to [place] law, we are only able to give a certain amount of pain medication to treat post operative pain, and we only give additional narcotics on a patient by patient basis.  -For most laparoscopic surgery, studies have shown that the majority of patients only need 10-15 narcotic pills, and for open surgeries most patients only need 15-20.   -Having appropriate expectations of pain and knowledge of pain management with non narcotics is important as we do not want anyone to become addicted to narcotic pain medication.  -Using ice packs in the first 48 hours and heating pads after 48 hours, wearing an abdominal binder (when recommended), and using over the counter medications are all ways to help with pain management.   -Simple acts like meditation and mindfulness practices after surgery can also help with pain control and research has proven the benefit of these practices.  Medication: Take tylenol and ibuprofen as needed for pain control, alternating every 4-6 hours.  Example:  Tylenol 1000mg @ 6am, 12noon, 6pm, 12midnight (Do not exceed 4000mg of tylenol a day). Ibuprofen 800mg @ 9am, 3pm, 9pm, 3am (Do not exceed 3600mg of ibuprofen a day).  Take Roxicodone for breakthrough pain every 4 hours.  Take Colace for constipation related to narcotic pain medication. If you do not have a bowel movement in 2 days, take Miralax   over the counter.  Drink plenty of water to also prevent constipation.   Contact Information: If you have questions or concerns, please call our office, [phone], Monday- Thursday 8AM-5PM and Friday 8AM-12Noon.  If it is after hours or on the weekend, please call [facility], [phone], [phone], and ask to speak to the surgeon on call for Dr. Eimy Plaza at [place].   

## 2021-04-22 NOTE — ED Provider Notes (Signed)
[facility] Provider Note   CSN: [id number] Arrival date & time: 04/22/21  [id number]     History  Chief Complaint  Patient presents with   Emesis    Kristin Morgan is a 26 y.o. female.  Patient c/o nausea and vomiting onset yesterday evening. Symptoms acute onset, moderate, episodic, recurrent. Emesis not bloody or bilious. No abd distension. Had bm yesterday. No diarrhea. No dysuria or hematuria, ?urinating less than normal. No known ill contacts, bad food ingestion, recent abx use or travel. No fever or chills. No cough or uri symptoms. No chest pain or sob.   The history is provided by the patient, a significant other and medical records.  Emesis Associated symptoms: no abdominal pain, no cough, no diarrhea, no fever, no headaches and no sore throat       Home Medications Prior to Admission medications   Medication Sig Start Date End Date Taking? Authorizing Provider  levonorgestrel (LILETTA) 20.1 MCG/DAY IUD 1 each by Intrauterine route once.    [provider]  Semaglutide-Weight Management 1.7 MG/0.75ML SOAJ Inject 1.7 mg into the skin once a week for 28 days. 04/13/21 05/11/21  Dettinger, Fransisca Kaufmann, MD  Semaglutide-Weight Management 2.4 MG/0.75ML SOAJ Inject 2.4 mg into the skin once a week for 28 days. 05/12/21 06/09/21  Dettinger, Fransisca Kaufmann, MD  triamcinolone ointment (KENALOG) 0.5 % Apply 1 application topically 2 (two) times daily. 03/07/20   Sharion Balloon, FNP      Allergies    Aspirin and Sulfa antibiotics    Review of Systems   Review of Systems  Constitutional:  Negative for fever.  HENT:  Negative for sore throat.   Eyes:  Negative for redness.  Respiratory:  Negative for cough and shortness of breath.   Cardiovascular:  Negative for chest pain.  Gastrointestinal:  Positive for nausea and vomiting. Negative for abdominal pain, constipation and diarrhea.  Genitourinary:  Negative for dysuria and flank pain.  Musculoskeletal:  Negative for  back pain.  Skin:  Negative for rash.  Neurological:  Negative for headaches.  Hematological:  Does not bruise/bleed easily.  Psychiatric/Behavioral:  Negative for confusion.    Physical Exam Updated Vital Signs BP 137/86 (BP Location: Left Arm)    Pulse 94    Temp 98.1 F (36.7 C) (Oral)    Resp 18    Ht 1.575 m (5\' 2" )    Wt 120 kg    LMP 04/11/2021 (Exact Date) Comment: IUD   SpO2 96%    BMI 48.39 kg/m  Physical Exam Vitals and nursing note reviewed.  Constitutional:      Appearance: Normal appearance. She is well-developed.  HENT:     Head: Atraumatic.     Nose: Nose normal.     Mouth/Throat:     Mouth: Mucous membranes are moist.     Pharynx: Oropharynx is clear. No oropharyngeal exudate or posterior oropharyngeal erythema.  Eyes:     General: No scleral icterus.    Conjunctiva/sclera: Conjunctivae normal.     Pupils: Pupils are equal, round, and reactive to light.  Neck:     Trachea: No tracheal deviation.  Cardiovascular:     Rate and Rhythm: Normal rate and regular rhythm.     Pulses: Normal pulses.     Heart sounds: Normal heart sounds. No murmur heard.   No friction rub. No gallop.  Pulmonary:     Effort: Pulmonary effort is normal. No respiratory distress.     Breath sounds: Normal  breath sounds.  Abdominal:     General: Bowel sounds are normal. There is no distension.     Palpations: Abdomen is soft. There is no mass.     Tenderness: There is no abdominal tenderness. There is no guarding or rebound.     Hernia: No hernia is present.  Genitourinary:    Comments: No cva tenderness.  Musculoskeletal:        General: No swelling.     Cervical back: Normal range of motion and neck supple. No rigidity. No muscular tenderness.  Skin:    General: Skin is warm and dry.     Findings: No rash.  Neurological:     Mental Status: She is alert.     Comments: Alert, speech normal.   Psychiatric:        Mood and Affect: Mood normal.    ED Results / Procedures /  Treatments   Labs (all labs ordered are listed, but only abnormal results are displayed) Results for orders placed or performed during the hospital encounter of 04/22/21  Urinalysis, Routine w reflex microscopic Urine, Clean Catch  Result Value Ref Range   Color, Urine YELLOW YELLOW   APPearance CLEAR CLEAR   Specific Gravity, Urine 1.025 1.005 - 1.030   pH 6.0 5.0 - 8.0   Glucose, UA NEGATIVE NEGATIVE mg/dL   Hgb urine dipstick TRACE (A) NEGATIVE   Bilirubin Urine NEGATIVE NEGATIVE   Ketones, ur NEGATIVE NEGATIVE mg/dL   Protein, ur 30 (A) NEGATIVE mg/dL   Nitrite NEGATIVE NEGATIVE   Leukocytes,Ua NEGATIVE NEGATIVE  Pregnancy, urine  Result Value Ref Range   Preg Test, Ur NEGATIVE NEGATIVE  Urinalysis, Microscopic (reflex)  Result Value Ref Range   RBC / HPF 0-5 0 - 5 RBC/hpf   WBC, UA 0-5 0 - 5 WBC/hpf   Bacteria, UA FEW (A) NONE SEEN   Squamous Epithelial / LPF >50 0 - 5   Mucus PRESENT      EKG None  Radiology No results found.  Procedures Procedures    Medications Ordered in ED Medications  sodium chloride 0.9 % bolus 1,000 mL (has no administration in time range)  ondansetron (ZOFRAN) injection 4 mg (has no administration in time range)    ED Course/ Medical Decision Making/ A&P                           Medical Decision Making Problems Addressed: Dehydration: acute illness or injury with systemic symptoms Gastroenteritis: acute illness or injury with systemic symptoms Nausea and vomiting in adult: acute illness or injury  Amount and/or Complexity of Data Reviewed Independent Historian: parent External Data Reviewed: labs and notes. Labs: ordered. Decision-making details documented in ED Course.  Risk Prescription drug management. Risk Details: Iv ns boluses. Iv anti-emetic therapy. If fails po trial post ivf, would consider inpatient management. Signed out to Dr Regenia Skeeter.    Iv ns bolus. Labs ordered. Zofran iv.   Reviewed nursing notes and  prior charts for additional history. External reports reviewed.  Additional hx from signi other.   Labs reviewed/interpreted by me - upreg neg. Ua neg for uti.   Trial  of po fluids.   1720, signed out to Dr Regenia Skeeter to recheck post ivf, and trial po, and dispo appropriately then.         Final Clinical Impression(s) / ED Diagnoses Final diagnoses:  None    Rx / DC Orders ED Discharge Orders  None         Lajean Saver, MD 04/22/21 [phone]

## 2021-04-22 NOTE — Anesthesia Procedure Notes (Signed)
Procedure Name: Intubation Date/Time: 04/22/2021 11:28 AM Performed by: Louann Sjogren, MD Pre-anesthesia Checklist: Patient identified, Emergency Drugs available, Suction available and Patient being monitored Patient Re-evaluated:Patient Re-evaluated prior to induction Oxygen Delivery Method: Circle system utilized Preoxygenation: Pre-oxygenation with 100% oxygen Induction Type: IV induction Ventilation: Mask ventilation without difficulty Laryngoscope Size: Mac and 3 Grade View: Grade III Tube type: Oral Number of attempts: 1 Airway Equipment and Method: Stylet and Oral airway Placement Confirmation: ETT inserted through vocal cords under direct vision, positive ETCO2 and breath sounds checked- equal and bilateral Tube secured with: Tape Dental Injury: Teeth and Oropharynx as per pre-operative assessment

## 2021-04-22 NOTE — Progress Notes (Signed)
[facility]  Updated Justin surgery is completed. Patient can go home if feeling ok. Rx to [facility].   Jill Alexanders has work note to stay at home Monday 1/16 given that it is Beat[place] Si and all three kids will be at home.  Algis Greenhouse, MD [facility] [address] [place] [place], [place] [address] [phone] (office)

## 2021-04-22 NOTE — Transfer of Care (Signed)
Immediate Anesthesia Transfer of Care Note  Patient: Kristin Morgan  Procedure(s) Performed: APPENDECTOMY LAPAROSCOPIC  Patient Location: PACU  Anesthesia Type:General  Level of Consciousness: awake and alert   Airway & Oxygen Therapy: Patient Spontanous Breathing  Post-op Assessment: Report given to RN and Post -op Vital signs reviewed and stable  Post vital signs: Reviewed and stable  Last Vitals:  Vitals Value Taken Time  BP    Temp    Pulse 106 04/22/21 1216  Resp    SpO2 95 % 04/22/21 1216  Vitals shown include unvalidated device data.  Last Pain:  Vitals:   04/22/21 0820  TempSrc:   PainSc: 5          Complications: No notable events documented.

## 2021-04-23 ENCOUNTER — Telehealth: Payer: Self-pay

## 2021-04-23 NOTE — Telephone Encounter (Signed)
Transition Care Management Follow-up Telephone Call Date of discharge and from where: 04/22/2021-+[place]  How have you been since you were released from the hospital? Patient stated she is doing fine.  Any questions or concerns? Yes patient had questions about cleaning incision. Discussed AVS instructions.   Items Reviewed: Did the pt receive and understand the discharge instructions provided? Yes  Medications obtained and verified? Yes  Other? No  Any new allergies since your discharge? No  Dietary orders reviewed? No Do you have support at home? Yes   Home Care and Equipment/Supplies: Were home health services ordered? not applicable If so, what is the name of the agency? N/A  Has the agency set up a time to come to the patient's home? not applicable Were any new equipment or medical supplies ordered?  No What is the name of the medical supply agency? N/A Were you able to get the supplies/equipment? not applicable Do you have any questions related to the use of the equipment or supplies? No  Functional Questionnaire: (I = Independent and D = Dependent) ADLs: I  Bathing/Dressing- I  Meal Prep- I  Eating- I  Maintaining continence- I  Transferring/Ambulation- I  Managing Meds- I  Follow up appointments reviewed:  PCP Hospital f/u appt confirmed? No   Specialist Hospital f/u appt confirmed? No   Are transportation arrangements needed? No  If their condition worsens, is the pt aware to call PCP or go to the Emergency Dept.? Yes Was the patient provided with contact information for the PCP's office or ED? Yes Was to pt encouraged to call back with questions or concerns? Yes

## 2021-04-24 ENCOUNTER — Ambulatory Visit: Payer: Medicaid Other | Admitting: Family Medicine

## 2021-04-24 ENCOUNTER — Encounter (HOSPITAL_COMMUNITY): Payer: Self-pay | Admitting: General Surgery

## 2021-04-24 VITALS — BP 104/59 | HR 83 | Temp 97.3°F | Ht 62.0 in | Wt 262.6 lb

## 2021-04-24 DIAGNOSIS — K353 Acute appendicitis with localized peritonitis, without perforation or gangrene: Secondary | ICD-10-CM | POA: Diagnosis not present

## 2021-04-24 DIAGNOSIS — Z6841 Body Mass Index (BMI) 40.0 and over, adult: Secondary | ICD-10-CM

## 2021-04-24 NOTE — Progress Notes (Signed)
Subjective:  Patient ID: Kristin Morgan, female    DOB: 1995-10-05  Age: 26 y.o. MRN: [id number]  CC: Hospitalization Follow-up   HPI Kristin Morgan presents for recheck of the incisions of her appendectomy done two days ago. Concerned they might look infected. No fever reported. Hurts to lift her 9 month old child.   Depression screen [facility] 2/9 04/24/2021 01/16/2021 01/16/2021  Decreased Interest 0 0 0  Down, Depressed, Hopeless 0 0 0  PHQ - 2 Score 0 0 0  Altered sleeping - 0 -  Tired, decreased energy - 0 -  Change in appetite - 0 -  Feeling bad or failure about yourself  - 0 -  Trouble concentrating - 0 -  Moving slowly or fidgety/restless - 0 -  Suicidal thoughts - 0 -  PHQ-9 Score - 0 -  Difficult doing work/chores - - -    History Kristin Morgan has a past medical history of Depression and Pregnant.   She has a past surgical history that includes Wisdom tooth extraction (12/2018) and laparoscopic appendectomy (N/A, 04/22/2021).   Her family history includes Hypertension in her maternal grandmother and mother; Mental illness in her mother.She reports that she has never smoked. She has never used smokeless tobacco. She reports that she does not currently use alcohol. She reports that she does not use drugs.    ROS Review of Systems  Constitutional:  Negative for fever.  Gastrointestinal:  Negative for abdominal pain.  Genitourinary:  Negative for difficulty urinating.   Objective:  BP (!) 104/59    Pulse 83    Temp (!) 97.3 F (36.3 C)    Ht 5\' 2"  (1.575 m)    Wt 262 lb 9.6 oz (119.1 kg)    LMP 04/11/2021 (Exact Date) Comment: IUD   SpO2 97%    BMI 48.03 kg/m   BP Readings from Last 3 Encounters:  04/24/21 (!) 104/59  04/22/21 (!) 106/56  01/16/21 121/77    Wt Readings from Last 3 Encounters:  04/24/21 262 lb 9.6 oz (119.1 kg)  04/22/21 264 lb 8.8 oz (120 kg)  01/16/21 265 lb (120.2 kg)     Physical Exam Cardiovascular:     Rate and Rhythm: Normal rate and regular  rhythm.  Pulmonary:     Breath sounds: Normal breath sounds.  Skin:    Comments: Three incisions of laparoscopic appendectomy noted intact at central abd above and below the umbilicus and to the left. There is minimal loosening of the film from the surgical glue at the right aspect of the incsion above the umbilicus. Slight bruising no erythema at the left incision. The inferior incision is within the fold of pannus. There is slight hyperemia of the region, but no erythema. The incision is intact.       Assessment & Plan:   Aj was seen today for hospitalization follow-up.  Diagnoses and all orders for this visit:  Acute appendicitis with localized peritonitis, without perforation, abscess, or gangrene  Morbid obesity with BMI of 40.0-44.9, adult ([place])       I have discontinued Romanda Harland's triamcinolone ointment, Semaglutide-Weight Management, Semaglutide-Weight Management, ondansetron, and oxyCODONE. I am also having her maintain her levonorgestrel.  Allergies as of 04/24/2021       Reactions   Aspirin Nausea And Vomiting   Sulfa Antibiotics Hives        Medication List        Accurate as of April 24, 2021  1:01 PM. If you have any  questions, ask your nurse or doctor.          STOP taking these medications    ondansetron 8 MG disintegrating tablet Commonly known as: ZOFRAN-ODT Stopped by: Claretta Fraise, MD   oxyCODONE 5 MG immediate release tablet Commonly known as: Roxicodone Stopped by: Claretta Fraise, MD   Semaglutide-Weight Management 1.7 MG/0.75ML Soaj Stopped by: Claretta Fraise, MD   Semaglutide-Weight Management 2.4 MG/0.75ML Soaj Stopped by: Claretta Fraise, MD   triamcinolone ointment 0.5 % Commonly known as: KENALOG Stopped by: Claretta Fraise, MD       TAKE these medications    levonorgestrel 20.1 MCG/DAY Iud Commonly known as: LILETTA 1 each by Intrauterine route once.       Wound care reviewed. Avoid lifting this week. Signs of  infection including redness pain swelling and discharge reminder given. Follow up for these.  Follow-up: Return if symptoms worsen or fail to improve.  Claretta Fraise, M.D.

## 2021-04-25 LAB — SURGICAL PATHOLOGY

## 2021-05-10 ENCOUNTER — Ambulatory Visit (INDEPENDENT_AMBULATORY_CARE_PROVIDER_SITE_OTHER): Payer: Medicaid Other | Admitting: General Surgery

## 2021-05-10 DIAGNOSIS — K353 Acute appendicitis with localized peritonitis, without perforation or gangrene: Secondary | ICD-10-CM

## 2021-05-10 NOTE — Progress Notes (Signed)
[facility]  I am calling the patient for post operative evaluation. This is not a billable encounter as it is under the global charges for the surgery.  The patient had a laparoscopic appendectomy on 04/22/2021. The patient reports that she is doing well. The are tolerating a diet, having good pain control, and having regular Bms.  The incisions are healing. The patient has no concerns.   Pathology: FINAL MICROSCOPIC DIAGNOSIS:   A. APPENDIX, APPENDECTOMY:  - Acute transmural appendicitis with serositis.  - Negative for malignancy.  Will see the patient PRN.   Algis Greenhouse, MD [facility] [address] [place], [place] [address] [phone] (office)

## 2021-06-23 DIAGNOSIS — J40 Bronchitis, not specified as acute or chronic: Secondary | ICD-10-CM | POA: Diagnosis not present

## 2021-06-23 DIAGNOSIS — R059 Cough, unspecified: Secondary | ICD-10-CM | POA: Diagnosis not present

## 2021-08-14 DIAGNOSIS — L02216 Cutaneous abscess of umbilicus: Secondary | ICD-10-CM | POA: Diagnosis not present

## 2021-08-17 ENCOUNTER — Emergency Department (HOSPITAL_COMMUNITY): Payer: Medicaid Other

## 2021-08-17 ENCOUNTER — Encounter (HOSPITAL_COMMUNITY): Payer: Self-pay | Admitting: *Deleted

## 2021-08-17 ENCOUNTER — Emergency Department (HOSPITAL_COMMUNITY)
Admission: EM | Admit: 2021-08-17 | Discharge: 2021-08-17 | Disposition: A | Discharge status: Home / Self Care | Payer: Medicaid Other | Attending: Emergency Medicine | Admitting: Emergency Medicine

## 2021-08-17 ENCOUNTER — Other Ambulatory Visit: Payer: Self-pay

## 2021-08-17 DIAGNOSIS — L03311 Cellulitis of abdominal wall: Secondary | ICD-10-CM | POA: Insufficient documentation

## 2021-08-17 DIAGNOSIS — D649 Anemia, unspecified: Secondary | ICD-10-CM | POA: Diagnosis not present

## 2021-08-17 DIAGNOSIS — R1030 Lower abdominal pain, unspecified: Secondary | ICD-10-CM | POA: Diagnosis not present

## 2021-08-17 DIAGNOSIS — L03316 Cellulitis of umbilicus: Secondary | ICD-10-CM | POA: Diagnosis not present

## 2021-08-17 DIAGNOSIS — D72829 Elevated white blood cell count, unspecified: Secondary | ICD-10-CM | POA: Insufficient documentation

## 2021-08-17 DIAGNOSIS — R21 Rash and other nonspecific skin eruption: Secondary | ICD-10-CM | POA: Insufficient documentation

## 2021-08-17 DIAGNOSIS — E876 Hypokalemia: Secondary | ICD-10-CM | POA: Diagnosis not present

## 2021-08-17 LAB — CBC WITH DIFFERENTIAL/PLATELET
Abs Immature Granulocytes: 0.05 10*3/uL (ref 0.00–0.07)
Basophils Absolute: 0 10*3/uL (ref 0.0–0.1)
Basophils Relative: 0 %
Eosinophils Absolute: 0.1 10*3/uL (ref 0.0–0.5)
Eosinophils Relative: 0 %
HCT: 36.4 % (ref 36.0–46.0)
Hemoglobin: 11.8 g/dL — ABNORMAL LOW (ref 12.0–15.0)
Immature Granulocytes: 0 %
Lymphocytes Relative: 12 %
Lymphs Abs: 1.6 10*3/uL (ref 0.7–4.0)
MCH: 24.5 pg — ABNORMAL LOW (ref 26.0–34.0)
MCHC: 32.4 g/dL (ref 30.0–36.0)
MCV: 75.5 fL — ABNORMAL LOW (ref 80.0–100.0)
Monocytes Absolute: 1.1 10*3/uL — ABNORMAL HIGH (ref 0.1–1.0)
Monocytes Relative: 8 %
Neutro Abs: 11.3 10*3/uL — ABNORMAL HIGH (ref 1.7–7.7)
Neutrophils Relative %: 80 %
Platelets: 330 10*3/uL (ref 150–400)
RBC: 4.82 MIL/uL (ref 3.87–5.11)
RDW: 14.8 % (ref 11.5–15.5)
WBC: 14.1 10*3/uL — ABNORMAL HIGH (ref 4.0–10.5)
nRBC: 0 % (ref 0.0–0.2)

## 2021-08-17 LAB — COMPREHENSIVE METABOLIC PANEL
ALT: 15 U/L (ref 0–44)
AST: 14 U/L — ABNORMAL LOW (ref 15–41)
Albumin: 3.9 g/dL (ref 3.5–5.0)
Alkaline Phosphatase: 78 U/L (ref 38–126)
Anion gap: 11 (ref 5–15)
BUN: 10 mg/dL (ref 6–20)
CO2: 24 mmol/L (ref 22–32)
Calcium: 8.6 mg/dL — ABNORMAL LOW (ref 8.9–10.3)
Chloride: 100 mmol/L (ref 98–111)
Creatinine, Ser: 0.7 mg/dL (ref 0.44–1.00)
GFR, Estimated: >60 mL/min (ref >60)
Glucose, Bld: 98 mg/dL (ref 70–99)
Potassium: 3.2 mmol/L — ABNORMAL LOW (ref 3.5–5.1)
Sodium: 135 mmol/L (ref 135–145)
Total Bilirubin: 0.9 mg/dL (ref 0.3–1.2)
Total Protein: 7.5 g/dL (ref 6.5–8.1)

## 2021-08-17 LAB — URINALYSIS, ROUTINE W REFLEX MICROSCOPIC
Bilirubin Urine: NEGATIVE
Glucose, UA: NEGATIVE mg/dL
Ketones, ur: 20 mg/dL — AB
Leukocytes,Ua: NEGATIVE
Nitrite: NEGATIVE
Protein, ur: 30 mg/dL — AB
Specific Gravity, Urine: 1.026 (ref 1.005–1.030)
pH: 6 (ref 5.0–8.0)

## 2021-08-17 LAB — POC URINE PREG, ED: Preg Test, Ur: NEGATIVE

## 2021-08-17 LAB — LIPASE, BLOOD: Lipase: 22 U/L (ref 11–51)

## 2021-08-17 MED ORDER — POVIDONE-IODINE 10 % EX SOLN
CUTANEOUS | Status: AC
  Filled 2021-08-17: qty 14.8

## 2021-08-17 MED ORDER — LIDOCAINE-EPINEPHRINE (PF) 2 %-1:200000 IJ SOLN
10.0000 mL | Freq: Once | INTRAMUSCULAR | Status: AC
  Administered 2021-08-17: 10 mL
  Filled 2021-08-17: qty 20

## 2021-08-17 MED ORDER — IOHEXOL 300 MG/ML  SOLN
100.0000 mL | Freq: Once | INTRAMUSCULAR | Status: AC | PRN
  Administered 2021-08-17: 100 mL via INTRAVENOUS

## 2021-08-17 MED ORDER — DOXYCYCLINE HYCLATE 100 MG PO TABS
100.0000 mg | ORAL_TABLET | Freq: Once | ORAL | Status: AC
  Administered 2021-08-17: 100 mg via ORAL
  Filled 2021-08-17: qty 1

## 2021-08-17 MED ORDER — DOXYCYCLINE HYCLATE 100 MG PO CAPS: 100.0000 mg | ORAL_CAPSULE | Freq: Two times a day (BID) | ORAL | 0 refills | Status: DC

## 2021-08-17 MED ORDER — KETOROLAC TROMETHAMINE 30 MG/ML IJ SOLN
30.0000 mg | Freq: Once | INTRAMUSCULAR | Status: AC
  Administered 2021-08-17: 30 mg via INTRAVENOUS
  Filled 2021-08-17 (×2): qty 1

## 2021-08-17 NOTE — ED Notes (Signed)
Site marked

## 2021-08-17 NOTE — Discharge Instructions (Addendum)
Take doxycycline twice daily for the next 10 days. ?Wash with soap and water. ?Try doing warm compresses with warm rags over the abdomen.  If the redness spreads outside of the lines drawn on the abdomen return back to the ED for evaluation.  Take Tylenol and Motrin for pain. ?

## 2021-08-17 NOTE — ED Provider Notes (Signed)
?[facility] ?Provider Note ? ? ?CSN: [id number] ?Arrival date & time: 08/17/21  1156 ? ?  ? ?History ? ?Chief Complaint  ?Patient presents with  ? Cellulitis  ? ? ?Kristin Morgan is a 26 y.o. female. ? ?HPI ? ?Patient presents due to cellulitis/abdominal pain.  States she noticed an erythematous warm rash to her abdomen last Saturday, she was seen in urgent care and given clindamycin which she has been taking for the last 4 days as prescribed 4 times daily.  States that despite that the erythema is spreading through the abdomen.  She feels like there is a hard lump above her laparoscopic scar, also having some drainage from her umbilicus.  She previously had a piercing there, this has been removed.  The pain is along the umbilical area, does not radiate elsewhere.  She has been feeling hot and cold at home but has not checked her temperature. ? ?Status post laparoscopic appendectomy. ? ?Home Medications ?Prior to Admission medications   ?Medication Sig Start Date End Date Taking? Authorizing Provider  ?doxycycline (VIBRAMYCIN) 100 MG capsule Take 1 capsule (100 mg total) by mouth 2 (two) times daily. 08/17/21  Yes Theron AristaSage, Taraya, PA-C  ?levonorgestrel (LILETTA) 20.1 MCG/DAY IUD 1 each by Intrauterine route once.    [provider]  ?   ? ?Allergies    ?Aspirin and Sulfa antibiotics   ? ?Review of Systems   ?Review of Systems ? ?Physical Exam ?Updated Vital Signs ?BP (!) 103/42   Pulse (!) 104   Temp 98.9 ?F (37.2 ?C) (Oral)   Resp 18   Ht 5\' 1"  (1.549 m)   Wt 120.2 kg   LMP 08/10/2021   SpO2 96%   BMI 50.07 kg/m?  ?Physical Exam ?Vitals and nursing note reviewed. Exam conducted with a chaperone present.  ?Constitutional:   ?   Appearance: Normal appearance.  ?HENT:  ?   Head: Normocephalic and atraumatic.  ?Eyes:  ?   General: No scleral icterus.    ?   Right eye: No discharge.     ?   Left eye: No discharge.  ?   Extraocular Movements: Extraocular movements intact.  ?   Pupils: Pupils  are equal, round, and reactive to light.  ?Cardiovascular:  ?   Rate and Rhythm: Normal rate and regular rhythm.  ?   Pulses: Normal pulses.  ?   Heart sounds: Normal heart sounds. No murmur heard. ?  No friction rub. No gallop.  ?Pulmonary:  ?   Effort: Pulmonary effort is normal. No respiratory distress.  ?   Breath sounds: Normal breath sounds.  ?Abdominal:  ?   General: Abdomen is flat. Bowel sounds are normal. There is no distension.  ?   Palpations: Abdomen is soft.  ?   Tenderness: There is abdominal tenderness.  ?   Comments: Clear drainage in the umbilicus not particularly purulent.  There is tenderness and indurated area above the umbilicus.  ?Skin: ?   General: Skin is warm and dry.  ?   Coloration: Skin is not jaundiced.  ?   Findings: Erythema and rash present.  ?Neurological:  ?   Mental Status: She is alert. Mental status is at baseline.  ?   Coordination: Coordination normal.  ? ? ? ?ED Results / Procedures / Treatments   ?Labs ?(all labs ordered are listed, but only abnormal results are displayed) ?Labs Reviewed  ?CBC WITH DIFFERENTIAL/PLATELET - Abnormal; Notable for the following components:  ?  Result Value  ? WBC 14.1 (*)   ? Hemoglobin 11.8 (*)   ? MCV 75.5 (*)   ? MCH 24.5 (*)   ? Neutro Abs 11.3 (*)   ? Monocytes Absolute 1.1 (*)   ? All other components within normal limits  ?COMPREHENSIVE METABOLIC PANEL - Abnormal; Notable for the following components:  ? Potassium 3.2 (*)   ? Calcium 8.6 (*)   ? AST 14 (*)   ? All other components within normal limits  ?URINALYSIS, ROUTINE W REFLEX MICROSCOPIC - Abnormal; Notable for the following components:  ? APPearance HAZY (*)   ? Hgb urine dipstick SMALL (*)   ? Ketones, ur 20 (*)   ? Protein, ur 30 (*)   ? Bacteria, UA RARE (*)   ? All other components within normal limits  ?LIPASE, BLOOD  ?POC URINE PREG, ED  ? ? ?EKG ?None ? ?Radiology ?CT Abdomen Pelvis W Contrast ? ?Result Date: 08/17/2021 ?CLINICAL DATA:  Redness and lower abdominal pain x5  days concern for cellulitis. EXAM: CT ABDOMEN AND PELVIS WITH CONTRAST TECHNIQUE: Multidetector CT imaging of the abdomen and pelvis was performed using the standard protocol following bolus administration of intravenous contrast. RADIATION DOSE REDUCTION: This exam was performed according to the departmental dose-optimization program which includes automated exposure control, adjustment of the mA and/or kV according to patient size and/or use of iterative reconstruction technique. CONTRAST:  OMNIPAQUE IOHEXOL 300 MG/ML  SOLN COMPARISON:  CT April 22, 2021 FINDINGS: Lower chest: No acute abnormality. Hepatobiliary: No suspicious hepatic lesion. Gallbladder is unremarkable. No biliary ductal dilation. Pancreas: No pancreatic ductal dilation or evidence of acute inflammation. Spleen: No splenomegaly or focal splenic lesion. Adrenals/Urinary Tract: Bilateral adrenal glands appear normal. No hydronephrosis. Kidneys demonstrate symmetric enhancement excretion of contrast material. Urinary bladder is nondistended limiting evaluation. Stomach/Bowel: No radiopaque enteric contrast material was administered. Stomach is unremarkable for degree of distension. There is no pathologic dilation of small or large bowel. The appendix appears surgically absent. Terminal ileum is normal. No evidence of acute bowel inflammation. Vascular/Lymphatic: Normal caliber abdominal aorta. No pathologically enlarged abdominal or pelvic lymph nodes. Reproductive: Intrauterine device.  No suspicious adnexal mass. Other: Skin thickening of the anterior abdominal wall extending from just above the umbilicus to the superior aspect of the pannus fold with subcutaneous stranding. Additionally there is a 3.3 x 2.1 cm walled off fluid collection extending along the umbilicus on image 64/2. Musculoskeletal: No acute osseous abnormality. IMPRESSION: Skin thickening of the anterior abdominal wall extending from just above the umbilicus to the  superior aspect of the pannus fold with subcutaneous stranding suggestive of cellulitis with a probable subcutaneous 3.3 x 2.1 cm abscess along the umbilicus. Electronically Signed   By: Maudry Mayhew M.D.   On: 08/17/2021 15:17   ? ?Procedures ?Marland Kitchen.Incision and Drainage ? ?Date/Time: 08/17/2021 10:34 PM ?Performed by: Theron Arista, PA-C ?Authorized by: Theron Arista, PA-C  ? ?Consent:  ?  Consent obtained:  Verbal ?  Consent given by:  Patient ?  Risks discussed:  Bleeding, incomplete drainage, pain and damage to other organs ?  Alternatives discussed:  No treatment ?Universal protocol:  ?  Procedure explained and questions answered to patient or proxy's satisfaction: yes   ?  Relevant documents present and verified: yes   ?  Test results available : yes   ?  Imaging studies available: yes   ?  Required blood products, implants, devices, and special equipment available: yes   ?  Site/side marked: yes   ?  Immediately prior to procedure, a time out was called: yes   ?  Patient identity confirmed:  Verbally with patient ?Location:  ?  Type:  Abscess ?  Location:  Trunk ?  Trunk location:  Abdomen ?Pre-procedure details:  ?  Skin preparation:  Betadine ?Sedation:  ?  Sedation type:  None ?Anesthesia:  ?  Anesthesia method:  Local infiltration ?  Local anesthetic:  Lidocaine 1% WITH epi ?Procedure type:  ?  Complexity:  Simple ?Procedure details:  ?  Incision types:  Single straight ?  Incision depth:  Subcutaneous ?  Drainage:  Serosanguinous ?  Drainage amount:  Scant ?  Wound treatment:  Wound left open ?  Packing materials:  None ?Post-procedure details:  ?  Procedure completion:  Tolerated well, no immediate complications  ? ? ?Medications Ordered in ED ?Medications  ?iohexol (OMNIPAQUE) 300 MG/ML solution 100 mL (100 mLs Intravenous Contrast Given 08/17/21 1454)  ?lidocaine-EPINEPHrine (XYLOCAINE W/EPI) 2 %-1:200000 (PF) injection 10 mL (10 mLs Infiltration Given by Other 08/17/21 1710)  ?povidone-iodine (BETADINE) 10 %  external solution (  Given 08/17/21 1711)  ?ketorolac (TORADOL) 30 MG/ML injection 30 mg (30 mg Intravenous Given 08/17/21 1704)  ?doxycycline (VIBRA-TABS) tablet 100 mg (100 mg Oral Given 08/17/21 1702)  ? ? ?ED [place]

## 2021-08-17 NOTE — ED Notes (Signed)
Provider notified re: pts request to speak about labs and CT results before discharge, PA at bedside ?

## 2021-08-17 NOTE — ED Triage Notes (Signed)
Pt went to urgent care x 3 days ago and was given antibiotics for cellulitis to her abdomen; pt states since then she has a hard area to the abdomen that has gotten harder and more painful ?

## 2021-08-18 ENCOUNTER — Telehealth: Payer: Self-pay

## 2021-08-18 NOTE — Telephone Encounter (Signed)
Transition Care Management Follow-up Telephone Call ?Date of discharge and from where: 08/17/2021 from [facility] ?How have you been since you were released from the hospital? Patient stated that she is feeling better. The abscess is not as red and she has started that abx rx'ed by the ED. Patient did not have any questions or concerns at this time.  ?Any questions or concerns? No ? ?Items Reviewed: ?Did the pt receive and understand the discharge instructions provided? Yes  ?Medications obtained and verified? Yes  ?Other? No  ?Any new allergies since your discharge? No  ?Dietary orders reviewed? No ?Do you have support at home? Yes  ? ?Functional Questionnaire: (I = Independent and D = Dependent) ?ADLs: I ? ?Bathing/Dressing- I ? ?Meal Prep- I ? ?Eating- I ? ?Maintaining continence- I ? ?Transferring/Ambulation- I ? ?Managing Meds- I ? ? ?Follow up appointments reviewed: ? ?PCP Hospital f/u appt confirmed? No  Encouraged to follow up with PCP.  ?Specialist Hospital f/u appt confirmed? No  Encouraged to follow up with Gen Surg.  ?Are transportation arrangements needed? No  ?If their condition worsens, is the pt aware to call PCP or go to the Emergency Dept.? Yes ?Was the patient provided with contact information for the PCP's office or ED? Yes ?Was to pt encouraged to call back with questions or concerns? Yes ? ?

## 2021-09-05 ENCOUNTER — Encounter: Payer: Self-pay | Admitting: Nurse Practitioner

## 2021-09-05 ENCOUNTER — Ambulatory Visit: Payer: Medicaid Other | Admitting: Nurse Practitioner

## 2021-09-05 VITALS — BP 117/73 | HR 84 | Temp 98.1°F | Resp 20 | Ht 61.0 in | Wt 267.0 lb

## 2021-09-05 DIAGNOSIS — R5383 Other fatigue: Secondary | ICD-10-CM | POA: Diagnosis not present

## 2021-09-05 DIAGNOSIS — G4489 Other headache syndrome: Secondary | ICD-10-CM

## 2021-09-05 DIAGNOSIS — G43009 Migraine without aura, not intractable, without status migrainosus: Secondary | ICD-10-CM | POA: Diagnosis not present

## 2021-09-05 MED ORDER — SUMATRIPTAN SUCCINATE 50 MG PO TABS: 50.0000 mg | ORAL_TABLET | Freq: Four times a day (QID) | ORAL | 0 refills | Status: DC | PRN

## 2021-09-05 MED ORDER — NURTEC 75 MG PO TBDP: 75.0000 mg | ORAL_TABLET | Freq: Once | ORAL | 0 refills | Status: DC | PRN

## 2021-09-05 NOTE — Patient Instructions (Signed)
Fatigue If you have fatigue, you feel tired all the time and have a lack of energy or a lack of motivation. Fatigue may make it difficult to start or complete tasks because of exhaustion. Occasional or mild fatigue is often a normal response to activity or life. However, long-term (chronic) or extreme fatigue may be a symptom of a medical condition such as: Depression. Not having enough red blood cells or hemoglobin in the blood (anemia). A problem with a small gland located in the lower front part of the neck (thyroid disorder). Rheumatologic conditions. These are problems related to the body's defense system (immune system). Infections, especially certain viral infections. Fatigue can also lead to negative health outcomes over time. Follow these instructions at home: Medicines Take over-the-counter and prescription medicines only as told by your health care provider. Take a multivitamin if told by your health care provider. Do not use herbal or dietary supplements unless they are approved by your health care provider. Eating and drinking  Avoid heavy meals in the evening. Eat a well-balanced diet, which includes lean proteins, whole grains, plenty of fruits and vegetables, and low-fat dairy products. Avoid eating or drinking too many products with caffeine in them. Avoid alcohol. Drink enough fluid to keep your urine pale yellow. Activity  Exercise regularly, as told by your health care provider. Use or practice techniques to help you relax, such as yoga, tai chi, meditation, or massage therapy. Lifestyle Change situations that cause you stress. Try to keep your work and personal schedules in balance. Do not use recreational or illegal drugs. General instructions Monitor your fatigue for any changes. Go to bed and get up at the same time every day. Avoid fatigue by pacing yourself during the day and getting enough sleep at night. Maintain a healthy weight. Contact a health care  provider if: Your fatigue does not get better. You have a fever. You suddenly lose or gain weight. You have headaches. You have trouble falling asleep or sleeping through the night. You feel angry, guilty, anxious, or sad. You have swelling in your legs or another part of your body. Get help right away if: You feel confused, feel like you might faint, or faint. Your vision is blurry or you have a severe headache. You have severe pain in your abdomen, your back, or the area between your waist and hips (pelvis). You have chest pain, shortness of breath, or an irregular or fast heartbeat. You are unable to urinate, or you urinate less than normal. You have abnormal bleeding from the rectum, nose, lungs, nipples, or, if you are female, the vagina. You vomit blood. You have thoughts about hurting yourself or others. These symptoms may be an emergency. Get help right away. Call 911. Do not wait to see if the symptoms will go away. Do not drive yourself to the hospital. Get help right away if you feel like you may hurt yourself or others, or have thoughts about taking your own life. Go to your nearest emergency room or: Call 911. Call the [facility] at [phone] or 988. This is open 24 hours a day. Text the Crisis Text Line at 979-823-0385. Summary If you have fatigue, you feel tired all the time and have a lack of energy or a lack of motivation. Fatigue may make it difficult to start or complete tasks because of exhaustion. Long-term (chronic) or extreme fatigue may be a symptom of a medical condition. Exercise regularly, as told by your health care provider.  Change situations that cause you stress. Try to keep your work and personal schedules in balance. This information is not intended to replace advice given to you by your health care provider. Make sure you discuss any questions you have with your health care provider. Document Revised: 01/16/2021 Document  Reviewed: 01/16/2021 Elsevier Patient Education  [place] Headache Without Cause A headache is pain or discomfort you feel around the head or neck area. There are many causes and types of headaches. In some cases, the cause may not be found. Follow these instructions at home: Watch your condition for any changes. Let your doctor know about them. Take these steps to help with your condition: Managing pain     Take over-the-counter and prescription medicines only as told by your doctor. This includes medicines for pain that are taken by mouth or put on the skin. Lie down in a dark, quiet room when you have a headache. If told, put ice on your head and neck area: Put ice in a plastic bag. Place a towel between your skin and the bag. Leave the ice on for 20 minutes, 2-3 times per day. Take off the ice if your skin turns bright red. This is very important. If you cannot feel pain, heat, or cold, you have a greater risk of damage to the area. If told, put heat on the affected area. Use the heat source that your doctor recommends, such as a moist heat pack or a heating pad. Place a towel between your skin and the heat source. Leave the heat on for 20-30 minutes. Take off the heat if your skin turns bright red. This is very important. If you cannot feel pain, heat, or cold, you have a greater risk of getting burned. Keep lights dim if bright lights bother you or make your headaches worse. Eating and drinking Eat meals on a regular schedule. If you drink alcohol: Limit how much you have to: 0-1 drink a day for women who are not pregnant. 0-2 drinks a day for men. Know how much alcohol is in a drink. In the [place] one drink equals one 12 oz bottle of beer (355 mL), one 5 oz glass of wine (148 mL), or one 1 oz glass of hard liquor (44 mL). Stop drinking caffeine, or drink less caffeine. General instructions  Keep a journal to find out if certain things bring on headaches. For  example, write down: What you eat and drink. How much sleep you get. Any change to your diet or medicines. Get a massage or try other ways to relax. Limit stress. Sit up straight. Do not tighten (tense) your muscles. Do not smoke or use any products that contain nicotine or tobacco. If you need help quitting, ask your doctor. Exercise regularly as told by your doctor. Get enough sleep. This often means 7-9 hours of sleep each night. Keep all follow-up visits. This is important. Contact a doctor if: Medicine does not help your symptoms. You have a headache that feels different than the other headaches. You feel like you may vomit (nauseous) or you vomit. You have a fever. Get help right away if: Your headache: Gets very bad quickly. Gets worse after a lot of physical activity. You have any of these symptoms: You continue to vomit. A stiff neck. Trouble seeing. Your eye or ear hurts. Trouble speaking. Weak muscles or you lose muscle control. You lose your balance or have trouble walking. You feel like you will pass out (  faint) or you pass out. You are mixed up (confused). You have a seizure. These symptoms may be an emergency. Get help right away. Call your local emergency services (911 in the [place] Do not wait to see if the symptoms will go away. Do not drive yourself to the hospital. Summary A headache is pain or discomfort that is felt around the head or neck area. There are many causes and types of headaches. In some cases, the cause may not be found. Keep a journal to help find out what causes your headaches. Watch your condition for any changes. Let your doctor know about them. Contact a doctor if you have a headache that is different from usual, or if medicine does not help your headache. Get help right away if your headache gets very bad, you throw up, you have trouble seeing, you lose your balance, or you have a seizure. This information is not intended to replace  advice given to you by your health care provider. Make sure you discuss any questions you have with your health care provider. Document Revised: 08/24/2020 Document Reviewed: 08/24/2020 Elsevier Patient Education  [place].

## 2021-09-05 NOTE — Progress Notes (Signed)
Acute Office Visit  Subjective:     Patient ID: Kristin Morgan, female    DOB: 12/10/1995, 26 y.o.   MRN: [id number]  Chief Complaint  Patient presents with   black out    Having issues with vision blacking out X 3 weeks  Pos for HA and dizziness      HPI Patient is in today for Fatigue  She reports recurrent fatigue which she describes as a lack of energy, feeling exhausted, feeling sleepy, and feeling weak. It began a few weeks ago and occurs all the time and every day. It is described as marked and worsening. She has not started new medications around the time the fatigue started.   Associated symptoms: No arthralgias No bleeding  No melena No chest discomfort  No heart palpitations No heart racing   No dyspnea No feeling depressed  No feeling anxious or under stress No fevers  No loss of appetite No nausea  No vomiting No sleeping problems    Wt Readings from Last 3 Encounters:  09/05/21 267 lb (121.1 kg)  08/17/21 265 lb (120.2 kg)  04/24/21 262 lb 9.6 oz (119.1 kg)    Lab Results  Component Value Date   WBC 14.1 (H) 08/17/2021   HGB 11.8 (L) 08/17/2021   HCT 36.4 08/17/2021   MCV 75.5 (L) 08/17/2021   PLT 330 08/17/2021  No results found for: TSH Lab Results  Component Value Date   NA 135 08/17/2021   K 3.2 (L) 08/17/2021   CO2 24 08/17/2021   BUN 10 08/17/2021   CREATININE 0.70 08/17/2021   CALCIUM 8.6 (L) 08/17/2021   GLUCOSE 98 08/17/2021     Headache: Patient complains of headache. She does not have a headache at this time.   Description of Headaches: Location of pain: frontal Radiation of pain?:none Character of pain:aching Severity of pain: 8 Accompanying symptoms: photophobia Prodromal sx?: Dizzy Rapidity of onset: gradual Typical duration of individual headache: a few months Are most headaches similar in presentation? yes Typical precipitants: Nothing  Temporal Pattern of Headaches: Started having HAs a few years ago Worst time of  day: afternoon Awaken from sleep?: no Seasonal pattern?: no 'Clustering' of HAs over time? no Overall pattern since problem began: unchanged  Degree of Functional Impairment: no  Current Use of Meds to Treat HA: Abortive meds? NSAID ( ibuprofen) Daily use? no Prophylactic meds? none  Additional Relevant History: History of head/neck trauma?  History of head/neck surgery? no Family h/o headache problems? no Use of meds that might worsen HAs? no Exposure to carbon monoxide? no Substance use:    Review of Systems  Constitutional:  Positive for malaise/fatigue. Negative for chills and fever.  HENT: Negative.    Eyes: Negative.   Respiratory: Negative.    Cardiovascular: Negative.   Genitourinary: Negative.   Musculoskeletal: Negative.   Skin: Negative.  Negative for rash.  Neurological:  Positive for headaches.  All other systems reviewed and are negative.      Objective:    BP 117/73   Pulse 84   Temp 98.1 F (36.7 C)   Resp 20   Ht 5\' 1"  (1.549 m)   Wt 267 lb (121.1 kg)   LMP 08/10/2021 (Approximate)   SpO2 99%   Breastfeeding No   BMI 50.45 kg/m  BP Readings from Last 3 Encounters:  09/05/21 117/73  08/17/21 (!) 103/42  04/24/21 (!) 104/59   Wt Readings from Last 3 Encounters:  09/05/21 267 lb (121.1  kg)  08/17/21 265 lb (120.2 kg)  04/24/21 262 lb 9.6 oz (119.1 kg)      Physical Exam Vitals and nursing note reviewed.  Constitutional:      Appearance: Normal appearance. She is obese.  HENT:     Head: Normocephalic.     Right Ear: External ear normal.     Left Ear: External ear normal.     Nose: Nose normal.     Mouth/Throat:     Mouth: Mucous membranes are moist.     Pharynx: Oropharynx is clear.  Eyes:     Conjunctiva/sclera: Conjunctivae normal.  Cardiovascular:     Rate and Rhythm: Normal rate and regular rhythm.     Pulses: Normal pulses.     Heart sounds: Normal heart sounds.  Pulmonary:     Effort: Pulmonary effort is normal.      Breath sounds: Normal breath sounds.  Abdominal:     General: Bowel sounds are normal.  Musculoskeletal:        General: Normal range of motion.  Skin:    Findings: No rash.  Neurological:     Mental Status: She is alert and oriented to person, place, and time.    No results found for any visits on 09/05/21.      Assessment & Plan:  Completed assessment and labs, results pending Imitrex RX sent to pharmacy for migraine, increase hydration, reduce salt in diet, weight loss recommend , follow up  as needed..  Problem List Items Addressed This Visit   None Visit Diagnoses     Fatigue, unspecified type    -  Primary   Relevant Orders   CBC with Differential   Vitamin B12   Other headache syndrome       Relevant Medications   SUMAtriptan (IMITREX) 50 MG tablet   Migraine without aura and without status migrainosus, not intractable       Relevant Medications   SUMAtriptan (IMITREX) 50 MG tablet       Meds ordered this encounter  Medications   DISCONTD: Rimegepant Sulfate (NURTEC) 75 MG TBDP    Sig: Take 75 mg by mouth once as needed for up to 1 dose.    Dispense:  30 tablet    Refill:  0    Order Specific Question:   Supervising Provider    Answer:   Standley Brooking   SUMAtriptan (IMITREX) 50 MG tablet    Sig: Take 1 tablet (50 mg total) by mouth every 6 (six) hours as needed for migraine. May repeat in 2 hours if headache persists or recurs.    Dispense:  10 tablet    Refill:  0    Order Specific Question:   Supervising Provider    Answer:   Mechele Claude [phone]    Return if symptoms worsen or fail to improve.  Daryll Drown, NP

## 2021-09-06 LAB — CBC WITH DIFFERENTIAL/PLATELET
Basophils Absolute: 0.1 10*3/uL (ref 0.0–0.2)
Basos: 1 %
EOS (ABSOLUTE): 0.1 10*3/uL (ref 0.0–0.4)
Eos: 1 %
Hematocrit: 40.3 % (ref 34.0–46.6)
Hemoglobin: 13.2 g/dL (ref 11.1–15.9)
Immature Grans (Abs): 0 10*3/uL (ref 0.0–0.1)
Immature Granulocytes: 0 %
Lymphocytes Absolute: 1.9 10*3/uL (ref 0.7–3.1)
Lymphs: 24 %
MCH: 24.9 pg — ABNORMAL LOW (ref 26.6–33.0)
MCHC: 32.8 g/dL (ref 31.5–35.7)
MCV: 76 fL — ABNORMAL LOW (ref 79–97)
Monocytes Absolute: 0.6 10*3/uL (ref 0.1–0.9)
Monocytes: 7 %
Neutrophils Absolute: 5.5 10*3/uL (ref 1.4–7.0)
Neutrophils: 67 %
Platelets: 328 10*3/uL (ref 150–450)
RBC: 5.3 x10E6/uL — ABNORMAL HIGH (ref 3.77–5.28)
RDW: 15.4 % (ref 11.7–15.4)
WBC: 8.1 10*3/uL (ref 3.4–10.8)

## 2021-09-06 LAB — VITAMIN B12: Vitamin B-12: 358 pg/mL (ref 232–1245)

## 2021-09-15 ENCOUNTER — Ambulatory Visit: Payer: Medicaid Other | Admitting: Family Medicine

## 2021-09-27 ENCOUNTER — Ambulatory Visit: Payer: Medicaid Other | Admitting: Family Medicine

## 2021-10-04 ENCOUNTER — Ambulatory Visit: Payer: Medicaid Other | Admitting: Family Medicine

## 2021-11-09 ENCOUNTER — Ambulatory Visit: Payer: Medicaid Other | Admitting: Family Medicine

## 2021-11-16 ENCOUNTER — Encounter: Payer: Self-pay | Admitting: Family Medicine

## 2021-11-16 ENCOUNTER — Ambulatory Visit: Payer: Medicaid Other | Admitting: Family Medicine

## 2022-01-30 ENCOUNTER — Encounter: Payer: Self-pay | Admitting: Nurse Practitioner

## 2022-01-30 ENCOUNTER — Telehealth (INDEPENDENT_AMBULATORY_CARE_PROVIDER_SITE_OTHER): Payer: Medicaid Other | Admitting: Nurse Practitioner

## 2022-01-30 DIAGNOSIS — Z20822 Contact with and (suspected) exposure to covid-19: Secondary | ICD-10-CM | POA: Diagnosis not present

## 2022-01-30 MED ORDER — ACETAMINOPHEN 500 MG PO TABS
500.0000 mg | ORAL_TABLET | Freq: Four times a day (QID) | ORAL | 0 refills | Status: DC | PRN
Start: 2022-01-30 — End: 2022-11-16

## 2022-01-30 MED ORDER — FLUTICASONE PROPIONATE 50 MCG/ACT NA SUSP: 2.0000 | Freq: Every day | NASAL | 6 refills | Status: DC

## 2022-01-30 MED ORDER — GUAIFENESIN ER 600 MG PO TB12: 600.0000 mg | ORAL_TABLET | Freq: Two times a day (BID) | ORAL | 0 refills | Status: DC

## 2022-01-30 NOTE — Progress Notes (Signed)
   Virtual Visit  Note Due to COVID-19 pandemic this visit was conducted virtually. This visit type was conducted due to national recommendations for restrictions regarding the COVID-19 Pandemic (e.g. social distancing, sheltering in place) in an effort to limit this patient's exposure and mitigate transmission in our community. All issues noted in this document were discussed and addressed.  A physical exam was not performed with this format.  I connected with Kristin Morgan on 01/30/22 at 1:30 PM by telephone and verified that I am speaking with the correct person using two identifiers. Kristin Morgan is currently located at home during visit. The provider, Ivy Lynn, NP is located in their office at time of visit.  I discussed the limitations, risks, security and privacy concerns of performing an evaluation and management service by telephone and the availability of in person appointments. I also discussed with the patient that there may be a patient responsible charge related to this service. The patient expressed understanding and agreed to proceed.   History and Present Illness:  URI  This is a new problem. The current episode started yesterday. The problem has been unchanged. There has been no fever. Associated symptoms include congestion, coughing and headaches. Pertinent negatives include no rash. She has tried acetaminophen for the symptoms. The treatment provided mild relief.    Review of Systems  Constitutional:  Positive for chills and fever. Negative for malaise/fatigue.  HENT:  Positive for congestion.   Respiratory:  Positive for cough.   Cardiovascular: Negative.   Gastrointestinal: Negative.   Musculoskeletal: Negative.   Skin: Negative.  Negative for itching and rash.  Neurological:  Positive for headaches.  All other systems reviewed and are negative.    Observations/Objective: Tele visit the patient is not been distress.  Assessment and Plan: Patient presents with  symptoms of cough, congestion, and exposure to positive COVID-19. -COVID-19/RSV/flu swab completed results pending.  Advised patient to Take meds as prescribed - Use a cool mist humidifier  -Use saline nose sprays frequently -Force fluids -For fever or aches or pains- take Tylenol or ibuprofen.    Follow Up Instructions: Follow-up for worsening unresolved symptoms    I discussed the assessment and treatment plan with the patient. The patient was provided an opportunity to ask questions and all were answered. The patient agreed with the plan and demonstrated an understanding of the instructions.   The patient was advised to call back or seek an in-person evaluation if the symptoms worsen or if the condition fails to improve as anticipated.  The above assessment and management plan was discussed with the patient. The patient verbalized understanding of and has agreed to the management plan. Patient is aware to call the clinic if symptoms persist or worsen. Patient is aware when to return to the clinic for a follow-up visit. Patient educated on when it is appropriate to go to the emergency department.   Time call ended: 1:41 PM  I provided 11 minutes of  non face-to-face time during this encounter.    Ivy Lynn, NP

## 2022-01-30 NOTE — Patient Instructions (Signed)

## 2022-01-31 ENCOUNTER — Other Ambulatory Visit: Payer: Self-pay | Admitting: Nurse Practitioner

## 2022-01-31 DIAGNOSIS — U071 COVID-19: Secondary | ICD-10-CM

## 2022-01-31 LAB — COVID-19, FLU A+B AND RSV
Influenza A, NAA: NOT DETECTED
Influenza B, NAA: NOT DETECTED
RSV, NAA: NOT DETECTED
SARS-CoV-2, NAA: DETECTED — AB

## 2022-01-31 MED ORDER — MOLNUPIRAVIR EUA 200MG CAPSULE: 4.0000 | ORAL_CAPSULE | Freq: Two times a day (BID) | ORAL | 0 refills | Status: AC

## 2022-04-09 DIAGNOSIS — R509 Fever, unspecified: Secondary | ICD-10-CM | POA: Diagnosis not present

## 2022-04-09 DIAGNOSIS — R051 Acute cough: Secondary | ICD-10-CM | POA: Diagnosis not present

## 2022-04-09 DIAGNOSIS — H6502 Acute serous otitis media, left ear: Secondary | ICD-10-CM | POA: Diagnosis not present

## 2022-04-23 ENCOUNTER — Telehealth: Payer: Self-pay | Admitting: Family Medicine

## 2022-04-23 NOTE — Telephone Encounter (Signed)
C/O cramping pain. Wants removed ASAP.  Appt scheduled 1/18 with [facility].

## 2022-04-26 ENCOUNTER — Ambulatory Visit: Payer: Medicaid Other

## 2022-05-21 ENCOUNTER — Encounter: Payer: Self-pay | Admitting: Family Medicine

## 2022-05-21 ENCOUNTER — Ambulatory Visit: Payer: Medicaid Other | Admitting: Nurse Practitioner

## 2022-06-19 DIAGNOSIS — R509 Fever, unspecified: Secondary | ICD-10-CM | POA: Diagnosis not present

## 2022-06-19 DIAGNOSIS — J029 Acute pharyngitis, unspecified: Secondary | ICD-10-CM | POA: Diagnosis not present

## 2022-06-19 DIAGNOSIS — H6993 Unspecified Eustachian tube disorder, bilateral: Secondary | ICD-10-CM | POA: Diagnosis not present

## 2022-06-19 DIAGNOSIS — H6502 Acute serous otitis media, left ear: Secondary | ICD-10-CM | POA: Diagnosis not present

## 2022-06-20 ENCOUNTER — Ambulatory Visit: Payer: Medicaid Other | Admitting: Family Medicine

## 2022-07-10 DIAGNOSIS — J039 Acute tonsillitis, unspecified: Secondary | ICD-10-CM | POA: Diagnosis not present

## 2022-11-16 ENCOUNTER — Ambulatory Visit: Payer: Medicaid Other | Admitting: Family Medicine

## 2022-11-16 ENCOUNTER — Encounter: Payer: Self-pay | Admitting: Family Medicine

## 2022-11-16 VITALS — BP 119/79 | HR 88 | Ht 61.0 in | Wt 276.0 lb

## 2022-11-16 DIAGNOSIS — Z6841 Body Mass Index (BMI) 40.0 and over, adult: Secondary | ICD-10-CM | POA: Diagnosis not present

## 2022-11-16 DIAGNOSIS — F339 Major depressive disorder, recurrent, unspecified: Secondary | ICD-10-CM

## 2022-11-16 MED ORDER — SEMAGLUTIDE-WEIGHT MANAGEMENT 0.5 MG/0.5ML ~~LOC~~ SOAJ
0.5000 mg | SUBCUTANEOUS | 0 refills | Status: AC
Start: 2022-12-15 — End: 2023-01-12

## 2022-11-16 MED ORDER — BUPROPION HCL ER (XL) 150 MG PO TB24: 150.0000 mg | ORAL_TABLET | Freq: Every day | ORAL | 2 refills | Status: DC

## 2022-11-16 MED ORDER — SEMAGLUTIDE-WEIGHT MANAGEMENT 2.4 MG/0.75ML ~~LOC~~ SOAJ
2.4000 mg | SUBCUTANEOUS | 0 refills | Status: DC
Start: 2023-03-12 — End: 2023-03-29

## 2022-11-16 MED ORDER — SEMAGLUTIDE-WEIGHT MANAGEMENT 1 MG/0.5ML ~~LOC~~ SOAJ: 1.0000 mg | SUBCUTANEOUS | 0 refills | Status: DC

## 2022-11-16 MED ORDER — SEMAGLUTIDE-WEIGHT MANAGEMENT 1.7 MG/0.75ML ~~LOC~~ SOAJ
1.7000 mg | SUBCUTANEOUS | 0 refills | Status: AC
Start: 2023-02-11 — End: 2023-03-11

## 2022-11-16 MED ORDER — SEMAGLUTIDE-WEIGHT MANAGEMENT 0.25 MG/0.5ML ~~LOC~~ SOAJ
0.2500 mg | SUBCUTANEOUS | 0 refills | Status: AC
Start: 2022-11-16 — End: 2022-12-14

## 2022-11-16 NOTE — Progress Notes (Addendum)
BP 119/79   Pulse 88   Ht 5\' 1"  (1.549 m)   Wt 276 lb (125.2 kg)   SpO2 98%   BMI 52.15 kg/m    Subjective:   Patient ID: Kristin Morgan, female    DOB: 03-16-1996, 27 y.o.   MRN: [id number]  HPI: Kristin Morgan is a 27 y.o. female presenting on 11/16/2022 for Medical Management of Chronic Issues, Obesity, and Migraine   HPI Patient's anxiety and depression are more complicated by the patient's morbid obesity.  Discussed weight loss and lifestyle modification and exercise with the patient.  She has been trying diet and reducing sodas and coming off sodas and she is just struggled doing it by herself.  Anxiety and depression She is feeling down and herself and she feels more irritable and easy to anger.  She just feels like she is not doing as well in general with life and her weight and her feeling about herself is a big part of it.  She denies any suicidal ideations or thoughts of hurting herself.  She does have 3 younger kids and that does increase stress.  She says a good kids but she just has increased stress and emotional distress.  She cannot always explain why it happens but just is something that she fights.  Many years ago she was treated for depression with different medicines including SSRIs and Seroquel and Geodon at 1 point.    11/16/2022   11:22 AM 09/05/2021   10:38 AM 04/24/2021   10:24 AM 01/16/2021   11:23 AM 01/16/2021   11:22 AM  Depression screen PHQ 2/9  Decreased Interest 1 0 0 0 0  Down, Depressed, Hopeless 1 0 0 0 0  PHQ - 2 Score 2 0 0 0 0  Altered sleeping 1   0   Tired, decreased energy 2   0   Change in appetite 1   0   Feeling bad or failure about yourself  3   0   Trouble concentrating 0   0   Moving slowly or fidgety/restless 0   0   Suicidal thoughts 0   0   PHQ-9 Score 9   0   Difficult doing work/chores Somewhat difficult          Relevant past medical, surgical, family and social history reviewed and updated as indicated. Interim medical history  since our last visit reviewed. Allergies and medications reviewed and updated.  Review of Systems  Constitutional:  Negative for chills and fever.  Eyes:  Negative for visual disturbance.  Respiratory:  Negative for chest tightness and shortness of breath.   Cardiovascular:  Negative for chest pain and leg swelling.  Genitourinary:  Negative for difficulty urinating and dysuria.  Musculoskeletal:  Negative for back pain and gait problem.  Skin:  Negative for rash.  Neurological:  Negative for dizziness, light-headedness and headaches.  Psychiatric/Behavioral:  Positive for dysphoric mood. Negative for agitation, behavioral problems, self-injury, sleep disturbance and suicidal ideas. The patient is nervous/anxious.   All other systems reviewed and are negative.   Per HPI unless specifically indicated above   Allergies as of 11/16/2022       Reactions   Aspirin Nausea And Vomiting   Sulfa Antibiotics Hives        Medication List        Accurate as of November 16, 2022 11:43 AM. If you have any questions, ask your nurse or doctor.  STOP taking these medications    acetaminophen 500 MG tablet Commonly known as: TYLENOL Stopped by: Elige Radon Zonie Crutcher   fluticasone 50 MCG/ACT nasal spray Commonly known as: FLONASE Stopped by: Elige Radon Alexzia Kasler   guaiFENesin 600 MG 12 hr tablet Commonly known as: Mucinex Stopped by: Elige Radon Kao Berkheimer   SUMAtriptan 50 MG tablet Commonly known as: Imitrex Stopped by: Elige Radon Cataleya Cristina       TAKE these medications    buPROPion 150 MG 24 hr tablet Commonly known as: Wellbutrin XL Take 1 tablet (150 mg total) by mouth daily. Started by: Elige Radon Rudolfo Brandow   levonorgestrel 20.1 MCG/DAY Iud IUD Commonly known as: LILETTA 1 each by Intrauterine route once.   Semaglutide-Weight Management 0.25 MG/0.5ML Soaj Inject 0.25 mg into the skin once a week for 28 days. Started by: Elige Radon Shawntia Mangal   Semaglutide-Weight  Management 0.5 MG/0.5ML Soaj Inject 0.5 mg into the skin once a week for 28 days. Start taking on: December 15, 2022 Started by: Elige Radon Maxon Kresse   Semaglutide-Weight Management 1 MG/0.5ML Soaj Inject 1 mg into the skin once a week for 28 days. Start taking on: January 13, 2023 Started by: Elige Radon Therin Vetsch   Semaglutide-Weight Management 1.7 MG/0.75ML Soaj Inject 1.7 mg into the skin once a week for 28 days. Start taking on: February 11, 2023 Started by: Elige Radon Erlin Gardella   Semaglutide-Weight Management 2.4 MG/0.75ML Soaj Inject 2.4 mg into the skin once a week for 28 days. Start taking on: March 12, 2023 Started by: Ivin Booty A Katianne Barre         Objective:   BP 119/79   Pulse 88   Ht 5\' 1"  (1.549 m)   Wt 276 lb (125.2 kg)   SpO2 98%   BMI 52.15 kg/m   Wt Readings from Last 3 Encounters:  11/16/22 276 lb (125.2 kg)  09/05/21 267 lb (121.1 kg)  08/17/21 265 lb (120.2 kg)    Physical Exam Vitals and nursing note reviewed.  Constitutional:      General: She is not in acute distress.    Appearance: She is well-developed. She is not diaphoretic.  Eyes:     Conjunctiva/sclera: Conjunctivae normal.  Cardiovascular:     Rate and Rhythm: Normal rate and regular rhythm.     Heart sounds: Normal heart sounds. No murmur heard. Pulmonary:     Effort: Pulmonary effort is normal. No respiratory distress.     Breath sounds: Normal breath sounds. No wheezing.  Musculoskeletal:        General: No swelling. Normal range of motion.  Skin:    General: Skin is warm and dry.     Findings: No rash.  Neurological:     Mental Status: She is alert and oriented to person, place, and time.     Coordination: Coordination normal.  Psychiatric:        Behavior: Behavior normal.       Assessment & Plan:   Problem List Items Addressed This Visit       Other   Morbid obesity with BMI of 40.0-44.9, adult (HCC) - Primary   Relevant Medications   Semaglutide-Weight Management  0.25 MG/0.5ML SOAJ   Semaglutide-Weight Management 0.5 MG/0.5ML SOAJ (Start on 12/15/2022)   Semaglutide-Weight Management 1 MG/0.5ML SOAJ (Start on 01/13/2023)   Semaglutide-Weight Management 1.7 MG/0.75ML SOAJ (Start on 02/11/2023)   Semaglutide-Weight Management 2.4 MG/0.75ML SOAJ (Start on 03/12/2023)   Other Relevant Orders   CBC with Differential/Platelet   CMP14+EGFR  Lipid panel   TSH   Bayer DCA Hb A1c Waived   Other Visit Diagnoses     Depression, recurrent (HCC)       Relevant Medications   buPROPion (WELLBUTRIN XL) 150 MG 24 hr tablet   Other Relevant Orders   CBC with Differential/Platelet   CMP14+EGFR   Lipid panel   TSH       Patient's BMI is >30 mg/m2.  Patient's current BMI is Body mass index is 52.15 kg/m.Marland Kitchen  Patient is currently enrolled in a healthy eating plan along with encouraged exercise.  Patient does not have a personal or family history of medullary thyroid carcinoma (MTC) or Multiple Endocrine Neoplasia syndrome type 2 (MEN 2).   Patient has Liletta in currently for birth control Follow up plan: Return if symptoms worsen or fail to improve, for 6-week recheck for depression and weight.  Counseling provided for all of the vaccine components Orders Placed This Encounter  Procedures   CBC with Differential/Platelet   CMP14+EGFR   Lipid panel   TSH   Bayer DCA Hb A1c Waived    Arville Care, MD [facility] 11/16/2022, 11:43 AM

## 2022-11-20 ENCOUNTER — Telehealth: Payer: Self-pay

## 2022-11-20 ENCOUNTER — Telehealth: Payer: Self-pay | Admitting: Family Medicine

## 2022-11-20 NOTE — Telephone Encounter (Signed)
PA request has been Submitted. New Encounter created for follow up. For additional info see Pharmacy Prior Auth telephone encounter from 11/20/22.

## 2022-11-20 NOTE — Telephone Encounter (Signed)
Pharmacy Patient Advocate Encounter   Received notification from Pt Calls Messages that prior authorization for Wegovy 0.25MG /0.5ML auto-injectors is required/requested.   Insurance verification completed.   The patient is insured through [facility] .   Per test claim: PA required; PA submitted to [facility] via CoverMyMeds Key/confirmation #/EOC [facility] Status is pending

## 2022-11-21 NOTE — Telephone Encounter (Signed)
Pharmacy Patient Advocate Encounter  Received notification from [facility] that Prior Authorization for Wegovy 0.25MG /0.5ML auto-injectors has been APPROVED from 11/20/22 to 05/19/23   PA #/Case ID/Reference #: [id number]

## 2022-12-03 DIAGNOSIS — H5213 Myopia, bilateral: Secondary | ICD-10-CM | POA: Diagnosis not present

## 2022-12-21 ENCOUNTER — Telehealth: Payer: Self-pay | Admitting: Family Medicine

## 2022-12-21 MED ORDER — SEMAGLUTIDE-WEIGHT MANAGEMENT 1 MG/0.5ML ~~LOC~~ SOAJ
1.0000 mg | SUBCUTANEOUS | 1 refills | Status: DC
Start: 2022-12-21 — End: 2023-02-27

## 2022-12-21 NOTE — Telephone Encounter (Signed)
Did she ever start 0.5 mg?,  If she is already taking it for at least a week or 2 then she can go ahead and asked them for the 1 mg prescription at the pharmacy.  If she did not ever start the 0.5 mg, then she can go ahead and try the 1 mg it just might cause her more stomach issues but I am okay with her going ahead and trying it

## 2022-12-21 NOTE — Telephone Encounter (Signed)
Pt called to see if Dr Dettinger got her message. Reviewed Dr Oris Drone notes with pt. Pt voiced understanding. She has not started the 0.5 yet because she was supposed to start to today but can't find it at any pharmacy. Needs Dr Dettinger to send in the 1mg  Rx.

## 2022-12-21 NOTE — Telephone Encounter (Signed)
Daine Gravel to the pharmacy

## 2022-12-21 NOTE — Telephone Encounter (Signed)
Pt called to let Dr Dettinger know that she is supposed to start taking the 0.5 of Wegovy but can't find it anywhere. Needs advise on what to do.

## 2022-12-24 NOTE — Telephone Encounter (Signed)
Pt has been notified.

## 2022-12-28 ENCOUNTER — Ambulatory Visit: Payer: Medicaid Other | Admitting: Family Medicine

## 2022-12-28 ENCOUNTER — Encounter: Payer: Self-pay | Admitting: Family Medicine

## 2022-12-28 VITALS — BP 118/80 | HR 87 | Temp 98.8°F | Ht 61.0 in | Wt 266.0 lb

## 2022-12-28 DIAGNOSIS — Z6841 Body Mass Index (BMI) 40.0 and over, adult: Secondary | ICD-10-CM | POA: Diagnosis not present

## 2022-12-28 DIAGNOSIS — F339 Major depressive disorder, recurrent, unspecified: Secondary | ICD-10-CM

## 2022-12-28 MED ORDER — BUPROPION HCL ER (XL) 150 MG PO TB24
150.0000 mg | ORAL_TABLET | Freq: Every day | ORAL | 3 refills | Status: DC
Start: 2022-12-28 — End: 2023-03-29

## 2022-12-28 NOTE — Progress Notes (Signed)
BP 118/80   Pulse 87   Temp 98.8 F (37.1 C)   Ht 5\' 1"  (1.549 m)   Wt 266 lb (120.7 kg)   SpO2 99%   BMI 50.26 kg/m    Subjective:   Patient ID: Kristin Morgan, female    DOB: 04-09-1996, 27 y.o.   MRN: [id number]  HPI: Kristin Morgan is a 27 y.o. female presenting on 12/28/2022 for Medical Management of Chronic Issues (6 week )   HPI Obesity and weight recheck Patient is coming in today for obesity and weight sugar check.  She is currently getting Wegovy.  She is down 10 pounds over the past 6 weeks.  Anxiety depression She has been doing better with anxiety and depression.  She has been liking the Wellbutrin.  She just has been having a lot going on with her son and possibly being diagnosed with autism with his history of trauma and vision issues but she is doing okay with it.  Denies any suicidal ideations or thoughts of hurting self.  Relevant past medical, surgical, family and social history reviewed and updated as indicated. Interim medical history since our last visit reviewed. Allergies and medications reviewed and updated.  Review of Systems  Constitutional:  Negative for chills and fever.  Eyes:  Negative for redness and visual disturbance.  Respiratory:  Negative for chest tightness and shortness of breath.   Cardiovascular:  Negative for chest pain and leg swelling.  Skin:  Negative for rash.  Neurological:  Negative for light-headedness and headaches.  Psychiatric/Behavioral:  Positive for dysphoric mood. Negative for agitation, behavioral problems, self-injury, sleep disturbance and suicidal ideas. The patient is nervous/anxious.   All other systems reviewed and are negative.   Per HPI unless specifically indicated above   Allergies as of 12/28/2022       Reactions   Aspirin Nausea And Vomiting   Sulfa Antibiotics Hives        Medication List        Accurate as of December 28, 2022  4:38 PM. If you have any questions, ask your nurse or doctor.           buPROPion 150 MG 24 hr tablet Commonly known as: Wellbutrin XL Take 1 tablet (150 mg total) by mouth daily.   levonorgestrel 20.1 MCG/DAY Iud IUD Commonly known as: LILETTA 1 each by Intrauterine route once.   Semaglutide-Weight Management 0.5 MG/0.5ML Soaj Inject 0.5 mg into the skin once a week for 28 days.   Semaglutide-Weight Management 1 MG/0.5ML Soaj Inject 1 mg into the skin once a week.   Semaglutide-Weight Management 1.7 MG/0.75ML Soaj Inject 1.7 mg into the skin once a week for 28 days. Start taking on: February 11, 2023   Semaglutide-Weight Management 2.4 MG/0.75ML Soaj Inject 2.4 mg into the skin once a week for 28 days. Start taking on: March 12, 2023         Objective:   BP 118/80   Pulse 87   Temp 98.8 F (37.1 C)   Ht 5\' 1"  (1.549 m)   Wt 266 lb (120.7 kg)   SpO2 99%   BMI 50.26 kg/m   Wt Readings from Last 3 Encounters:  12/28/22 266 lb (120.7 kg)  11/16/22 276 lb (125.2 kg)  09/05/21 267 lb (121.1 kg)    Physical Exam Vitals and nursing note reviewed.  Constitutional:      General: She is not in acute distress.    Appearance: She is well-developed. She is not  diaphoretic.  Eyes:     Conjunctiva/sclera: Conjunctivae normal.  Musculoskeletal:        General: No tenderness. Normal range of motion.  Skin:    General: Skin is warm and dry.     Findings: No rash.  Neurological:     Mental Status: She is alert and oriented to person, place, and time.     Coordination: Coordination normal.  Psychiatric:        Behavior: Behavior normal.       Assessment & Plan:   Problem List Items Addressed This Visit       Other   Morbid obesity with BMI of 40.0-44.9, adult (HCC) - Primary   Other Visit Diagnoses     Depression, recurrent (HCC)       Relevant Medications   buPROPion (WELLBUTRIN XL) 150 MG 24 hr tablet       Continue Wellbutrin and Wegovy, seems to be doing well, also focusing on diet and reducing portion sizes and  activity.  Anxiety depression seems to be doing well, continue Wellbutrin. Follow up plan: Return in about 3 months (around 03/29/2023), or if symptoms worsen or fail to improve, for Anxiety depression recheck.  Counseling provided for all of the vaccine components No orders of the defined types were placed in this encounter.   Arville Care, MD [facility] 12/28/2022, 4:38 PM

## 2022-12-29 IMAGING — CT CT ABD-PELV W/ CM
2 of 4 series · 16 of 46 positions shown, 18 images · IV contrast (Omnipaque or Isovue)
Comparison: No priors.

CLINICAL DATA: 25-year-old female with history of nausea and
vomiting.

EXAM:
CT ABDOMEN AND PELVIS WITH CONTRAST
TECHNIQUE: Multidetector CT imaging of the abdomen and pelvis was performed
using the standard protocol following bolus administration of
intravenous contrast.

[Series 2: axial st · axial · 0.98mm/px · z∈[+793,+1283]mm · 13 of 108 slices shown, 15 images]
[im 5/108  soft-tissue]
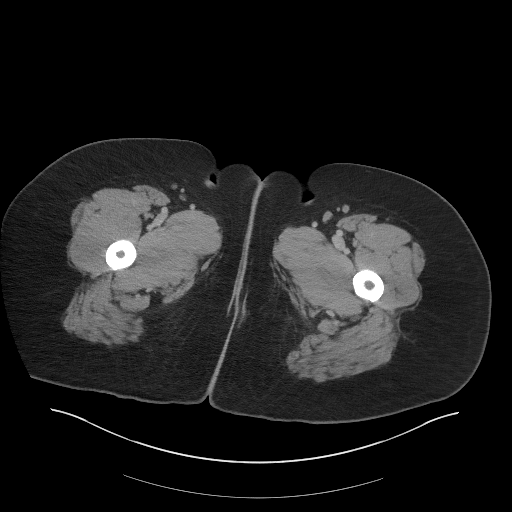
[im 5/108  bone]
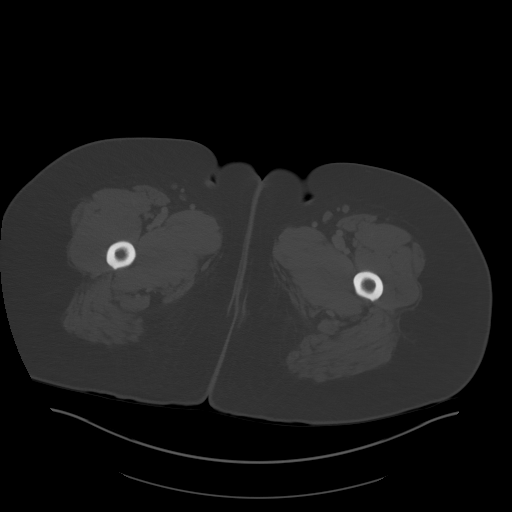
[im 14/108  soft-tissue]
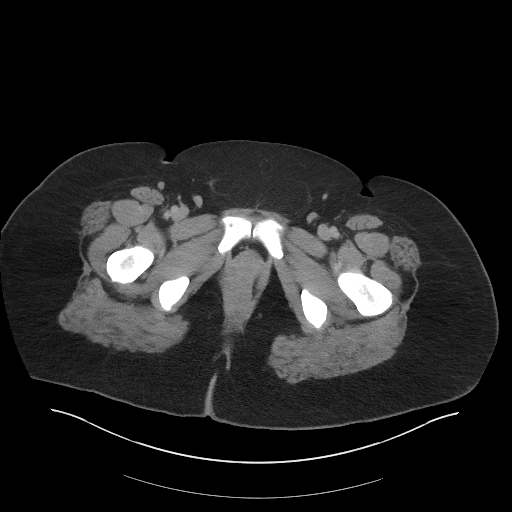
[im 23/108  soft-tissue]
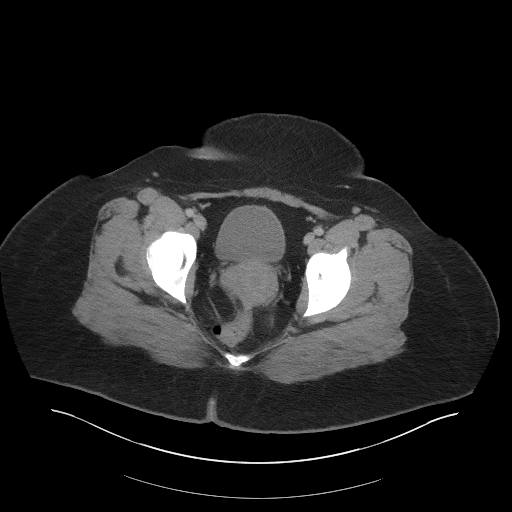
[im 32/108  soft-tissue]
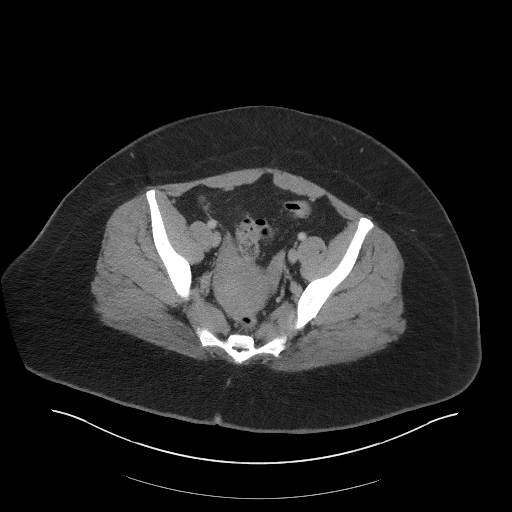
[im 36/108  soft-tissue]
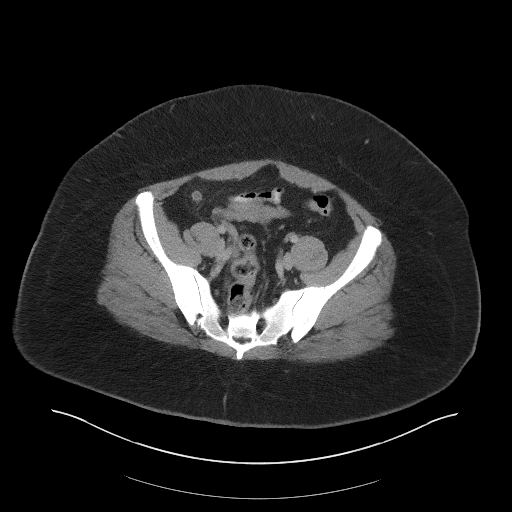
[im 45/108  soft-tissue]
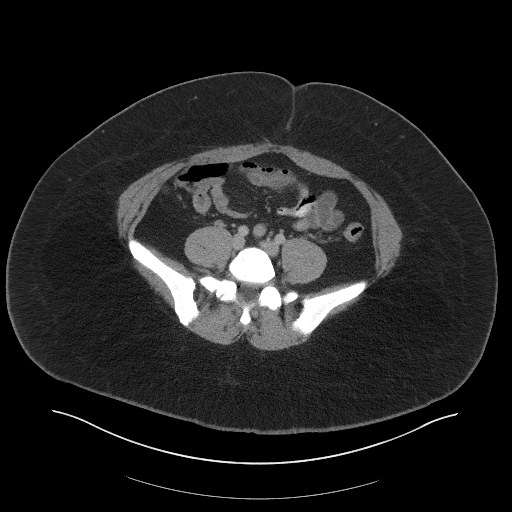
[im 54/108  soft-tissue]
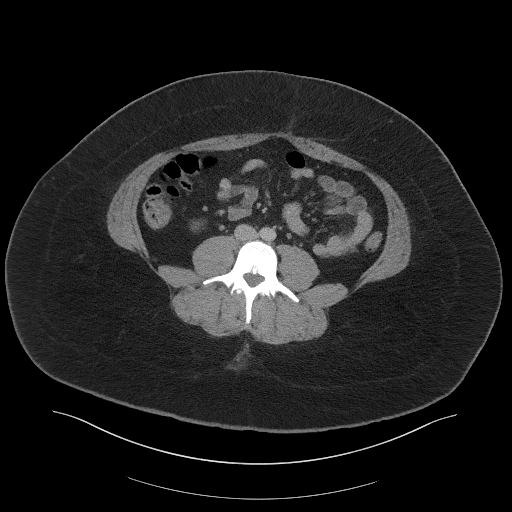
[im 63/108  soft-tissue]
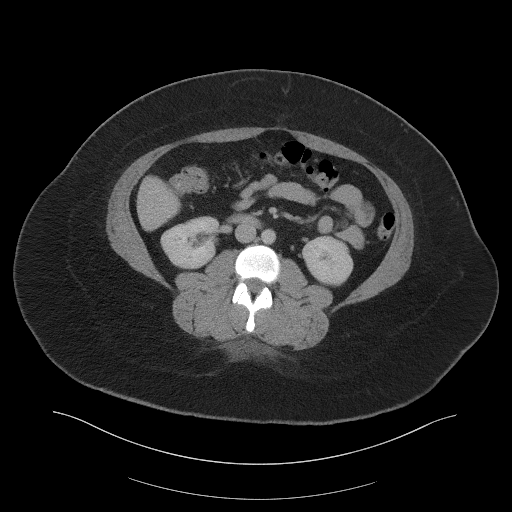
[im 72/108  soft-tissue]
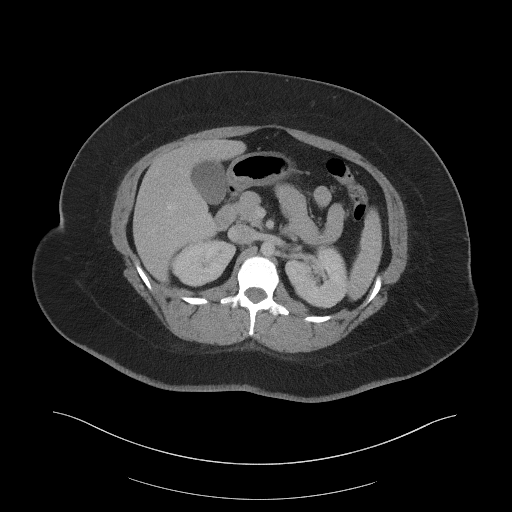
[im 72/108  bone]
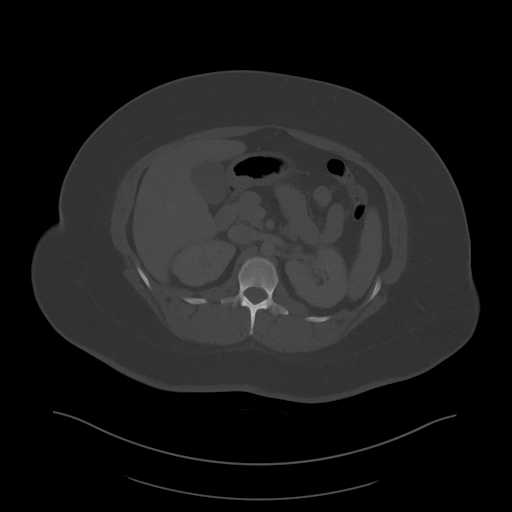
[im 76/108  soft-tissue]
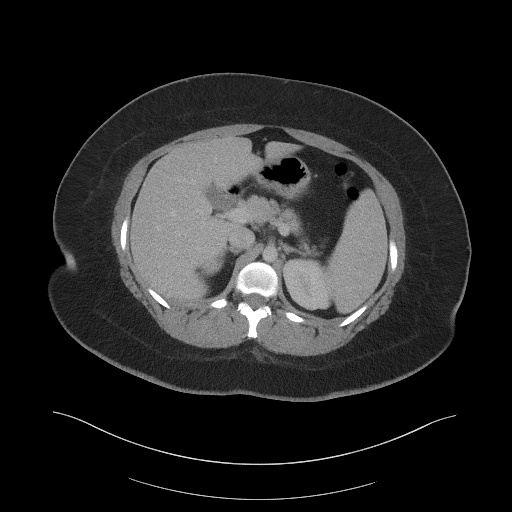
[im 85/108  soft-tissue]
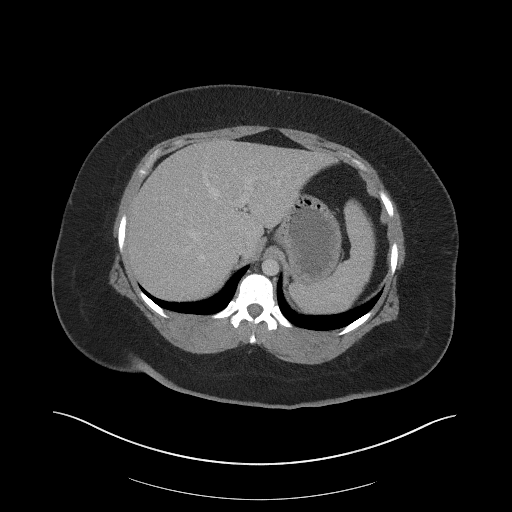
[im 94/108  soft-tissue]
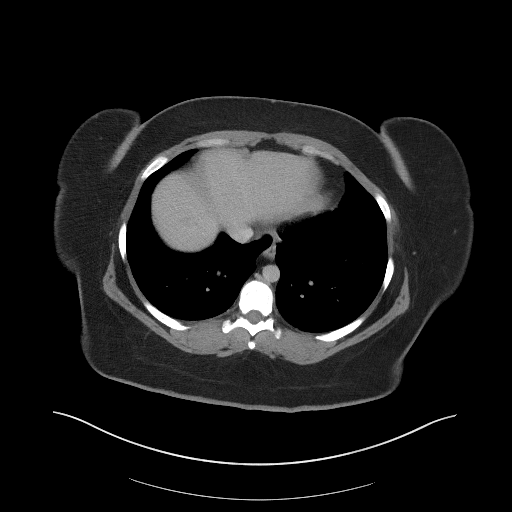
[im 103/108  soft-tissue]
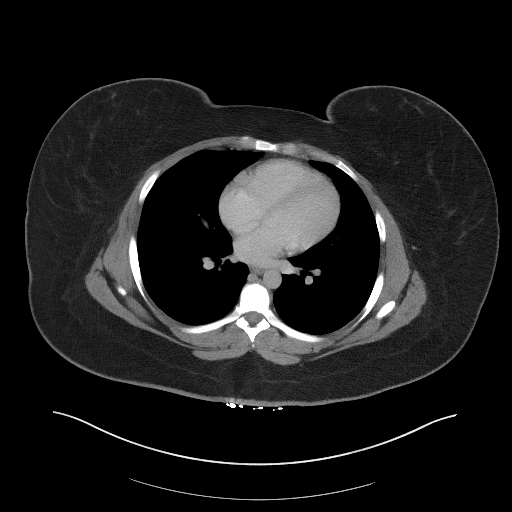

[Series 5: coronal st · coronal · 1.01mm/px · 3 of 136 slices shown]
[im 46/136  soft-tissue]
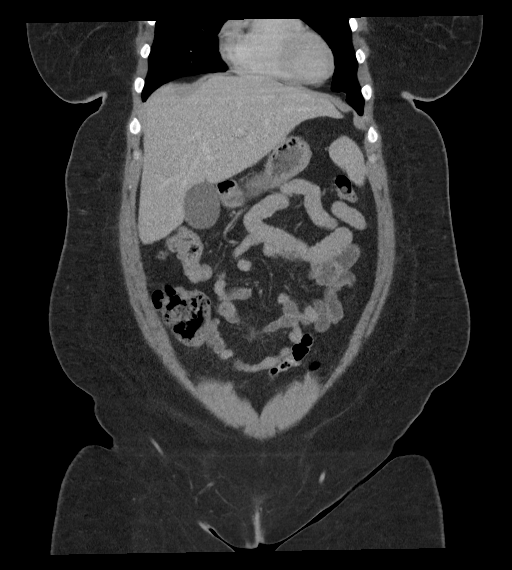
[im 61/136  soft-tissue]
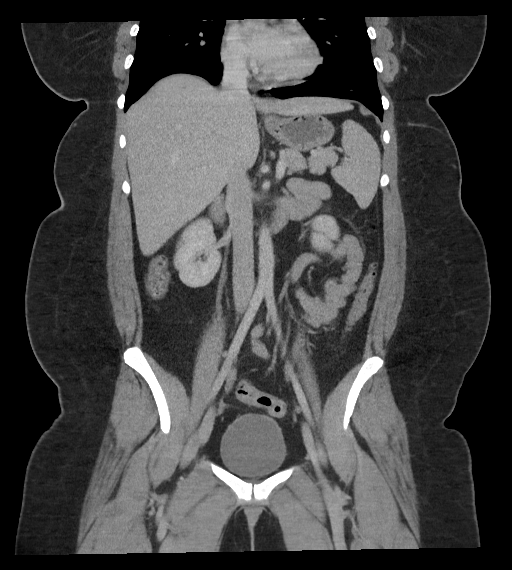
[im 76/136  soft-tissue]
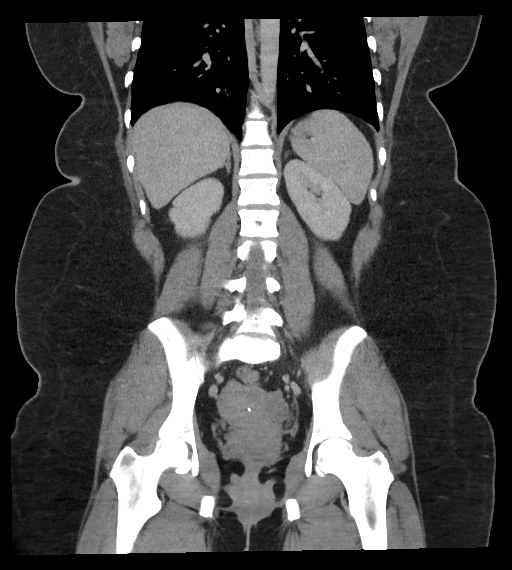

[16 of 46 positions shown; findings below may reference images not displayed]

RADIATION DOSE REDUCTION: This exam was performed according to the
departmental dose-optimization program which includes automated
exposure control, adjustment of the mA and/or kV according to
patient size and/or use of iterative reconstruction technique.

CONTRAST:  100mL OMNIPAQUE IOHEXOL 300 MG/ML  SOLN
FINDINGS: Lower chest: Unremarkable.

Hepatobiliary: No suspicious cystic or solid hepatic lesions. No
intra or extrahepatic biliary ductal dilatation. Gallbladder is
normal in appearance.

Pancreas: No pancreatic mass. No pancreatic ductal dilatation. No
pancreatic or peripancreatic fluid collections or inflammatory
changes.

Spleen: Unremarkable.

Adrenals/Urinary Tract: Bilateral kidneys and bilateral adrenal
glands are normal in appearance. No hydroureteronephrosis. Urinary
bladder is normal in appearance.

Stomach/Bowel: The appearance of the stomach is normal. There is no
pathologic dilatation of small bowel or colon. A few scattered
colonic diverticulae are noted, without surrounding inflammatory
changes to suggest an acute diverticulitis at this time. The
appendix is dilated and inflamed, indicative of an acute
appendicitis.

Appendix: Location: Inferior to the cecum

Diameter: 11 mm

Appendicolith: None

Mucosal hyper-enhancement: Present

Extraluminal gas: None

Periappendiceal collection: Mild periappendiceal inflammatory
changes in the adjacent fat, but no well organized fluid collection.

Vascular/Lymphatic: No significant atherosclerotic disease, aneurysm
or dissection noted in the abdominal or pelvic vasculature. No
lymphadenopathy noted in the abdomen or pelvis.

Reproductive: IUD present in the uterus. Uterus and ovaries are
otherwise unremarkable in appearance.

Other: No significant volume of ascites.  No pneumoperitoneum.

Musculoskeletal: There are no aggressive appearing lytic or blastic
lesions noted in the visualized portions of the skeleton.
IMPRESSION: 1. Acute appendicitis, as above. Surgical consultation is
recommended.
2. Mild colonic diverticulosis without evidence of acute
diverticulitis at this time.

## 2023-01-25 ENCOUNTER — Encounter: Payer: Self-pay | Admitting: Family Medicine

## 2023-01-25 ENCOUNTER — Ambulatory Visit: Payer: Medicaid Other | Admitting: Family Medicine

## 2023-01-25 VITALS — BP 116/77 | HR 96 | Temp 98.0°F | Ht 61.0 in | Wt 261.4 lb

## 2023-01-25 DIAGNOSIS — L282 Other prurigo: Secondary | ICD-10-CM | POA: Diagnosis not present

## 2023-01-25 DIAGNOSIS — M2550 Pain in unspecified joint: Secondary | ICD-10-CM | POA: Diagnosis not present

## 2023-01-25 DIAGNOSIS — F339 Major depressive disorder, recurrent, unspecified: Secondary | ICD-10-CM | POA: Diagnosis not present

## 2023-01-25 MED ORDER — PREDNISONE 20 MG PO TABS
40.0000 mg | ORAL_TABLET | Freq: Every day | ORAL | 0 refills | Status: AC
Start: 2023-01-25 — End: 2023-01-30

## 2023-01-25 NOTE — Patient Instructions (Signed)
CeraVe Cetaphil  Equate Eczema

## 2023-01-25 NOTE — Progress Notes (Signed)
Subjective:  Patient ID: Kristin Morgan, female    DOB: 1996/01/08, 27 y.o.   MRN: [id number]  Patient Care Team: Dettinger, Elige Radon, MD as PCP - General (Family Medicine)   Chief Complaint:  Urticaria (Bilateral arms x 1 day.  Also states that it itches all over but no rash. ) and Joint Pain (X 3 days along with headache )   HPI: Kristin Morgan is a 27 y.o. female presenting on 01/25/2023 for Urticaria (Bilateral arms x 1 day.  Also states that it itches all over but no rash. ) and Joint Pain (X 3 days along with headache )   Discussed the use of AI scribe software for clinical note transcription with the patient, who gave verbal consent to proceed.  History of Present Illness   The patient, with a history of eczema, has been experiencing a decline in her overall health over the past week after a month of feeling well. She reports a sensation of not feeling like herself, accompanied by widespread joint pain, particularly in the tops of her feet and fingers. The pain is so severe that it extends to her head.  In addition to the joint pain, she developed a prickly rash that started a day prior to the consultation. The rash initially appeared on her legs and has since spread. She denies any recent changes in her lifestyle, such as new soaps or detergents, and has no known food allergies.  The patient also mentions a recent reduction in stress levels following her son's evaluation for potential developmental delays, which turned out to be negative. Despite this, she reports a lack of motivation to engage in activities.  She has been on a medication regimen for the past three months, which she does not believe is causing her current symptoms. However, she expresses concern about the potential interaction between her current medication and the new symptoms. She plans to discuss the possibility of increasing her dose of Wellbutrin with her primary care provider.  The patient has a limited family  history, only maintaining contact with her mother and grandmother, and is unaware of any family history of autoimmune disorders. She has had eczema since infancy.         Relevant past medical, surgical, family, and social history reviewed and updated as indicated.  Allergies and medications reviewed and updated. Data reviewed: Chart in Epic.   Past Medical History:  Diagnosis Date   Depression    Pregnant     Past Surgical History:  Procedure Laterality Date   LAPAROSCOPIC APPENDECTOMY N/A 04/22/2021   Procedure: APPENDECTOMY LAPAROSCOPIC;  Surgeon: Lucretia Roers, MD;  Location: [facility];  Service: General;  Laterality: N/A;   WISDOM TOOTH EXTRACTION  12/2018    Social History   Socioeconomic History   Marital status: Single    Spouse name: Azucena Freed   Number of children: 2   Years of education: Not on file   Highest education level: Not on file  Occupational History   Not on file  Tobacco Use   Smoking status: Never   Smokeless tobacco: Never  Vaping Use   Vaping status: Never Used  Substance and Sexual Activity   Alcohol use: Not Currently   Drug use: No   Sexual activity: Yes    Birth control/protection: None, I.U.D.  Other Topics Concern   Not on file  Social History Narrative   Not on file   Social Determinants of Health   Financial Resource Strain: Low  Risk  (08/21/2019)   Overall Financial Resource Strain (CARDIA)    Difficulty of Paying Living Expenses: Not hard at all  Food Insecurity: No Food Insecurity (08/21/2019)   Hunger Vital Sign    Worried About Running Out of Food in the Last Year: Never true    Ran Out of Food in the Last Year: Never true  Transportation Needs: No Transportation Needs (08/21/2019)   PRAPARE - Administrator, Civil Service (Medical): No    Lack of Transportation (Non-Medical): No  Physical Activity: Insufficiently Active (08/21/2019)   Exercise Vital Sign    Days of Exercise per Week: 2 days    Minutes of  Exercise per Session: 30 min  Stress: No Stress Concern Present (07/08/2019)   [employer] of Occupational Health - Occupational Stress Questionnaire    Feeling of Stress : Not at all  Social Connections: Moderately Isolated (08/21/2019)   Social Connection and Isolation Panel [NHANES]    Frequency of Communication with Friends and Family: More than three times a week    Frequency of Social Gatherings with Friends and Family: More than three times a week    Attends Religious Services: Never    Database administrator or Organizations: No    Attends Banker Meetings: Never    Marital Status: Living with partner  Intimate Partner Violence: Not At Risk (08/21/2019)   Humiliation, Afraid, Rape, and Kick questionnaire    Fear of Current or Ex-Partner: No    Emotionally Abused: No    Physically Abused: No    Sexually Abused: No    Outpatient Encounter Medications as of 01/25/2023  Medication Sig   buPROPion (WELLBUTRIN XL) 150 MG 24 hr tablet Take 1 tablet (150 mg total) by mouth daily.   levonorgestrel (LILETTA) 20.1 MCG/DAY IUD 1 each by Intrauterine route once.   predniSONE (DELTASONE) 20 MG tablet Take 2 tablets (40 mg total) by mouth daily with breakfast for 5 days.   Semaglutide-Weight Management 1 MG/0.5ML SOAJ Inject 1 mg into the skin once a week.   [START ON 02/11/2023] Semaglutide-Weight Management 1.7 MG/0.75ML SOAJ Inject 1.7 mg into the skin once a week for 28 days. (Patient not taking: Reported on 01/25/2023)   [START ON 03/12/2023] Semaglutide-Weight Management 2.4 MG/0.75ML SOAJ Inject 2.4 mg into the skin once a week for 28 days. (Patient not taking: Reported on 01/25/2023)   No facility-administered encounter medications on file as of 01/25/2023.    Allergies  Allergen Reactions   Aspirin Nausea And Vomiting   Sulfa Antibiotics Hives    Pertinent ROS per HPI, otherwise unremarkable      Objective:  BP 116/77   Pulse 96   Temp 98 F (36.7 C)  (Temporal)   Ht 5\' 1"  (1.549 m)   Wt 261 lb 6.4 oz (118.6 kg)   LMP 01/18/2023   SpO2 94%   BMI 49.39 kg/m    Wt Readings from Last 3 Encounters:  01/25/23 261 lb 6.4 oz (118.6 kg)  12/28/22 266 lb (120.7 kg)  11/16/22 276 lb (125.2 kg)    Physical Exam Vitals and nursing note reviewed.  Constitutional:      General: She is not in acute distress.    Appearance: Normal appearance. She is well-developed and well-groomed. She is morbidly obese. She is not ill-appearing, toxic-appearing or diaphoretic.  HENT:     Head: Normocephalic and atraumatic.     Jaw: There is normal jaw occlusion.     Right  Ear: Hearing normal.     Left Ear: Hearing normal.     Nose: Nose normal.     Mouth/Throat:     Lips: Pink.     Mouth: Mucous membranes are moist.     Pharynx: Oropharynx is clear. Uvula midline.  Eyes:     General: Lids are normal.     Extraocular Movements: Extraocular movements intact.     Conjunctiva/sclera: Conjunctivae normal.     Pupils: Pupils are equal, round, and reactive to light.  Neck:     Trachea: Trachea and phonation normal.  Cardiovascular:     Rate and Rhythm: Normal rate and regular rhythm.     Chest Wall: PMI is not displaced.     Pulses: Normal pulses.     Heart sounds: Normal heart sounds. No murmur heard.    No friction rub. No gallop.  Pulmonary:     Effort: Pulmonary effort is normal. No respiratory distress.     Breath sounds: Normal breath sounds. No wheezing.  Abdominal:     General: There is no abdominal bruit.     Palpations: There is no hepatomegaly or splenomegaly.  Musculoskeletal:        General: No swelling. Normal range of motion.     Cervical back: Normal range of motion and neck supple.     Right lower leg: No edema.     Left lower leg: No edema.  Skin:    General: Skin is warm and dry.     Capillary Refill: Capillary refill takes less than 2 seconds.     Coloration: Skin is not cyanotic, jaundiced or pale.     Findings: Rash (arms  and legs, red and slightly raised) present.  Neurological:     General: No focal deficit present.     Mental Status: She is alert and oriented to person, place, and time.     Sensory: Sensation is intact.     Motor: Motor function is intact.     Coordination: Coordination is intact.     Gait: Gait is intact.     Deep Tendon Reflexes: Reflexes are normal and symmetric.  Psychiatric:        Attention and Perception: Attention and perception normal.        Mood and Affect: Mood and affect normal.        Speech: Speech normal.        Behavior: Behavior normal. Behavior is cooperative.        Thought Content: Thought content normal.        Cognition and Memory: Cognition and memory normal.        Judgment: Judgment normal.    Physical Exam   SKIN: Rash on legs with prickly sensation.        Results for orders placed or performed in visit on 01/30/22  COVID-19, Flu A+B and RSV   Specimen: Nasopharyngeal(NP) swabs in vial transport medium  Result Value Ref Range   SARS-CoV-2, NAA Detected (A) Not Detected   Influenza A, NAA Not Detected Not Detected   Influenza B, NAA Not Detected Not Detected   RSV, NAA Not Detected Not Detected   Test Information: Comment        Pertinent labs & imaging results that were available during my care of the patient were reviewed by me and considered in my medical decision making.  Assessment & Plan:  Vanae was seen today for urticaria and joint pain.  Diagnoses and all orders for this visit:  Multiple joint pain -     ANA Comprehensive Panel -     Thyroid Panel With TSH -     C-reactive protein -     predniSONE (DELTASONE) 20 MG tablet; Take 2 tablets (40 mg total) by mouth daily with breakfast for 5 days. -     VITAMIN D 25 Hydroxy (Vit-D Deficiency, Fractures)  Pruritic rash -     ANA Comprehensive Panel -     Thyroid Panel With TSH -     C-reactive protein -     predniSONE (DELTASONE) 20 MG tablet; Take 2 tablets (40 mg total) by mouth  daily with breakfast for 5 days.  Depression, recurrent (HCC) -     Thyroid Panel With TSH -     VITAMIN D 25 Hydroxy (Vit-D Deficiency, Fractures)     Assessment and Plan    Generalized Joint Pain and Rash New onset of generalized joint pain and rash. No known triggers or recent changes in lifestyle or environment. No known family history of autoimmune disorders. -Order ANA, CRP, thyroid panel, and Vitamin D levels. -Start Prednisone for 5 days. -Recommend use of Equate Eczema cream.  Depression Recent worsening of symptoms despite being on Wellbutrin. -Advise patient to message Dr. Louanne Skye about possibly increasing the dose of Wellbutrin.  Eczema Chronic condition, patient has had since infancy. -Continue current management.          Continue all other maintenance medications.  Follow up plan: Return if symptoms worsen or fail to improve.   Continue healthy lifestyle choices, including diet (rich in fruits, vegetables, and lean proteins, and low in salt and simple carbohydrates) and exercise (at least 30 minutes of moderate physical activity daily).  Educational handout given for rash  The above assessment and management plan was discussed with the patient. The patient verbalized understanding of and has agreed to the management plan. Patient is aware to call the clinic if they develop any new symptoms or if symptoms persist or worsen. Patient is aware when to return to the clinic for a follow-up visit. Patient educated on when it is appropriate to go to the emergency department.   Kari Baars, FNP-C [facility] [phone]

## 2023-01-26 LAB — ANA COMPREHENSIVE PANEL
Anti JO-1: <0.2 AI (ref 0.0–0.9)
Centromere Ab Screen: <0.2 AI (ref 0.0–0.9)
Chromatin Ab SerPl-aCnc: <0.2 AI (ref 0.0–0.9)
ENA RNP Ab: 6.1 AI — ABNORMAL HIGH (ref 0.0–0.9)
ENA SM Ab Ser-aCnc: <0.2 AI (ref 0.0–0.9)
ENA SSA (RO) Ab: <0.2 AI (ref 0.0–0.9)
ENA SSB (LA) Ab: <0.2 AI (ref 0.0–0.9)
Scleroderma (Scl-70) (ENA) Antibody, IgG: <0.2 AI (ref 0.0–0.9)
dsDNA Ab: 7 [IU]/mL (ref 0–9)

## 2023-01-26 LAB — THYROID PANEL WITH TSH
Free Thyroxine Index: 2 (ref 1.2–4.9)
T3 Uptake Ratio: 23 % — ABNORMAL LOW (ref 24–39)
T4, Total: 8.8 ug/dL (ref 4.5–12.0)
TSH: 1.29 u[IU]/mL (ref 0.450–4.500)

## 2023-01-26 LAB — VITAMIN D 25 HYDROXY (VIT D DEFICIENCY, FRACTURES): Vit D, 25-Hydroxy: 34 ng/mL (ref 30.0–100.0)

## 2023-01-26 LAB — C-REACTIVE PROTEIN: CRP: 15 mg/L — ABNORMAL HIGH (ref 0–10)

## 2023-01-28 ENCOUNTER — Telehealth: Payer: Self-pay | Admitting: Family Medicine

## 2023-01-28 ENCOUNTER — Other Ambulatory Visit: Payer: Self-pay

## 2023-01-28 DIAGNOSIS — M2550 Pain in unspecified joint: Secondary | ICD-10-CM

## 2023-01-28 NOTE — Telephone Encounter (Signed)
Please review lab results.

## 2023-02-11 ENCOUNTER — Other Ambulatory Visit: Payer: Self-pay | Admitting: Family Medicine

## 2023-02-27 ENCOUNTER — Encounter: Payer: Self-pay | Admitting: Family Medicine

## 2023-02-27 MED ORDER — SEMAGLUTIDE-WEIGHT MANAGEMENT 1 MG/0.5ML ~~LOC~~ SOAJ
1.0000 mg | SUBCUTANEOUS | 1 refills | Status: DC
Start: 2023-02-27 — End: 2023-03-29

## 2023-03-29 ENCOUNTER — Encounter: Payer: Self-pay | Admitting: Family Medicine

## 2023-03-29 ENCOUNTER — Ambulatory Visit: Payer: Medicaid Other | Admitting: Family Medicine

## 2023-03-29 VITALS — BP 113/82 | HR 96 | Ht 61.0 in | Wt 246.0 lb

## 2023-03-29 DIAGNOSIS — F339 Major depressive disorder, recurrent, unspecified: Secondary | ICD-10-CM | POA: Insufficient documentation

## 2023-03-29 DIAGNOSIS — Z6841 Body Mass Index (BMI) 40.0 and over, adult: Secondary | ICD-10-CM

## 2023-03-29 MED ORDER — BUPROPION HCL ER (XL) 150 MG PO TB24
150.0000 mg | ORAL_TABLET | Freq: Every day | ORAL | 3 refills | Status: DC
Start: 2023-03-29 — End: 2023-07-25

## 2023-03-29 MED ORDER — SEMAGLUTIDE-WEIGHT MANAGEMENT 1 MG/0.5ML ~~LOC~~ SOAJ
1.0000 mg | SUBCUTANEOUS | 1 refills | Status: DC
Start: 2023-03-29 — End: 2023-04-22

## 2023-03-29 NOTE — Progress Notes (Signed)
BP 113/82   Pulse 96   Ht 5\' 1"  (1.549 m)   Wt 246 lb (111.6 kg)   SpO2 93%   BMI 46.48 kg/m    Subjective:   Patient ID: Kristin Morgan, female    DOB: 1996-03-26, 27 y.o.   MRN: [id number]  HPI: Kristin Morgan is a 27 y.o. female presenting on 03/29/2023 for Medical Management of Chronic Issues and Weight Management Screening   HPI Obesity and weight recheck Patient's depression are more complicated by the patient's morbid obesity.  Discussed weight loss and lifestyle modification and exercise with the patient.  Patient is currently taking Wegovy 1 mg.  She feels like it is still helping and she is down 5 pounds from previous  Depression recheck Patient is currently taking Wellbutrin 150 mg daily.  She feels like it is helping.  She still gets irritable some but her depression and feeling sad is doing a lot better and she is satisfied with the medication.  She says she may be getting a little more heat flashes sometimes and does not know if that is from this or from the hormonal stuff.    12/28/2022    4:12 PM 12/28/2022    4:09 PM 11/16/2022   11:22 AM 09/05/2021   10:38 AM 04/24/2021   10:24 AM  Depression screen PHQ 2/9  Decreased Interest 1 1 1  0 0  Down, Depressed, Hopeless 1 1 1  0 0  PHQ - 2 Score 2 2 2  0 0  Altered sleeping 0 0 1    Tired, decreased energy 0 0 2    Change in appetite 0 0 1    Feeling bad or failure about yourself  1 1 3     Trouble concentrating 0 0 0    Moving slowly or fidgety/restless 0 0 0    Suicidal thoughts 0 0 0    PHQ-9 Score 3 3 9     Difficult doing work/chores Somewhat difficult Somewhat difficult Somewhat difficult       Relevant past medical, surgical, family and social history reviewed and updated as indicated. Interim medical history since our last visit reviewed. Allergies and medications reviewed and updated.  Review of Systems  Constitutional:  Negative for chills and fever.  Eyes:  Negative for visual disturbance.  Respiratory:   Negative for chest tightness and shortness of breath.   Cardiovascular:  Negative for chest pain and leg swelling.  Genitourinary:  Negative for difficulty urinating and dysuria.  Musculoskeletal:  Negative for back pain and gait problem.  Skin:  Negative for rash.  Neurological:  Negative for dizziness, light-headedness and headaches.  Psychiatric/Behavioral:  Negative for agitation and behavioral problems.   All other systems reviewed and are negative.   Per HPI unless specifically indicated above   Allergies as of 03/29/2023       Reactions   Aspirin Nausea And Vomiting   Sulfa Antibiotics Hives        Medication List        Accurate as of March 29, 2023  2:40 PM. If you have any questions, ask your nurse or doctor.          buPROPion 150 MG 24 hr tablet Commonly known as: Wellbutrin XL Take 1 tablet (150 mg total) by mouth daily.   levonorgestrel 20.1 MCG/DAY Iud IUD Commonly known as: LILETTA 1 each by Intrauterine route once.   Semaglutide-Weight Management 1 MG/0.5ML Soaj Inject 1 mg into the skin once a week. What changed: Another  medication with the same name was removed. Continue taking this medication, and follow the directions you see here. Changed by: Elige Radon Saralee Bolick         Objective:   BP 113/82   Pulse 96   Ht 5\' 1"  (1.549 m)   Wt 246 lb (111.6 kg)   SpO2 93%   BMI 46.48 kg/m   Wt Readings from Last 3 Encounters:  03/29/23 246 lb (111.6 kg)  01/25/23 261 lb 6.4 oz (118.6 kg)  12/28/22 266 lb (120.7 kg)    Physical Exam Vitals and nursing note reviewed.  Constitutional:      General: She is not in acute distress.    Appearance: Normal appearance. She is well-developed. She is not diaphoretic.  Eyes:     Conjunctiva/sclera: Conjunctivae normal.  Cardiovascular:     Rate and Rhythm: Normal rate and regular rhythm.     Heart sounds: Normal heart sounds. No murmur heard. Pulmonary:     Effort: Pulmonary effort is normal. No  respiratory distress.     Breath sounds: Normal breath sounds. No wheezing.  Musculoskeletal:        General: No swelling.  Skin:    General: Skin is warm and dry.     Findings: No rash.  Neurological:     Mental Status: She is alert and oriented to person, place, and time.     Coordination: Coordination normal.  Psychiatric:        Behavior: Behavior normal.       Assessment & Plan:   Problem List Items Addressed This Visit       Other   Morbid obesity with BMI of 40.0-44.9, adult (HCC) - Primary   Relevant Medications   Semaglutide-Weight Management 1 MG/0.5ML SOAJ   Depression, recurrent (HCC)   Relevant Medications   buPROPion (WELLBUTRIN XL) 150 MG 24 hr tablet    Patient feels like she is doing okay on current medicine, will keep the same dose of the [facility] and the antidepressant.  No changes today Follow up plan: Return in about 3 months (around 06/27/2023), or if symptoms worsen or fail to improve, for Depression and obesity recheck.  Counseling provided for all of the vaccine components No orders of the defined types were placed in this encounter.   Arville Care, MD [facility] 03/29/2023, 2:40 PM

## 2023-04-04 DIAGNOSIS — B349 Viral infection, unspecified: Secondary | ICD-10-CM | POA: Diagnosis not present

## 2023-04-04 DIAGNOSIS — R070 Pain in throat: Secondary | ICD-10-CM | POA: Diagnosis not present

## 2023-04-04 DIAGNOSIS — Z20822 Contact with and (suspected) exposure to covid-19: Secondary | ICD-10-CM | POA: Diagnosis not present

## 2023-04-04 DIAGNOSIS — H6993 Unspecified Eustachian tube disorder, bilateral: Secondary | ICD-10-CM | POA: Diagnosis not present

## 2023-04-20 ENCOUNTER — Encounter: Payer: Self-pay | Admitting: Family Medicine

## 2023-04-22 MED ORDER — WEGOVY 1.7 MG/0.75ML ~~LOC~~ SOAJ: 1.7000 mg | SUBCUTANEOUS | 2 refills | Status: DC

## 2023-05-29 ENCOUNTER — Encounter: Payer: Self-pay | Admitting: Family Medicine

## 2023-05-30 ENCOUNTER — Telehealth: Payer: Self-pay | Admitting: Pharmacy Technician

## 2023-05-30 ENCOUNTER — Other Ambulatory Visit (HOSPITAL_COMMUNITY): Payer: Self-pay

## 2023-05-30 NOTE — Telephone Encounter (Signed)
Pharmacy Patient Advocate Encounter   Received notification from CoverMyMeds that prior authorization for [facility] 1.7MG /0.75ML PEN is required/requested.   Insurance verification completed.   The patient is insured through [facility] .   Insurance requires pt to have a weight and BMI measurement within the last 45 days. Patient will need to have an office visit before reauthorization can be initiated. Please reach out to patient to schedule an office visit.

## 2023-05-30 NOTE — Telephone Encounter (Signed)
Please let the patient know that she needs to come back in for a visit for reauthorization for this with the weight and BMI because her last visit was just outside the 45 days required by the insurance, it looks like she was seen about 51 days ago

## 2023-05-30 NOTE — Telephone Encounter (Signed)
Appt made for 2/21. Pt made aware.

## 2023-05-31 ENCOUNTER — Encounter: Payer: Self-pay | Admitting: Family Medicine

## 2023-05-31 ENCOUNTER — Ambulatory Visit: Payer: Medicaid Other | Admitting: Family Medicine

## 2023-05-31 VITALS — BP 123/77 | HR 94 | Ht 61.0 in | Wt 237.0 lb

## 2023-05-31 DIAGNOSIS — Z6841 Body Mass Index (BMI) 40.0 and over, adult: Secondary | ICD-10-CM

## 2023-05-31 MED ORDER — WEGOVY 2.4 MG/0.75ML ~~LOC~~ SOAJ: 2.4000 mg | SUBCUTANEOUS | 3 refills | Status: DC

## 2023-05-31 NOTE — Progress Notes (Signed)
BP 123/77   Pulse 94   Ht 5\' 1"  (1.549 m)   Wt 237 lb (107.5 kg)   SpO2 98%   BMI 44.78 kg/m    Subjective:   Patient ID: Kristin Morgan, female    DOB: Aug 02, 1995, 28 y.o.   MRN: [id number]  HPI: Kristin Morgan is a 28 y.o. female presenting on 05/31/2023 for Medical Management of Chronic Issues and Obesity   HPI Obesity and weight recheck Patient is coming in to discuss weight loss and obesity.  She is still taking the 1.7 of Wegovy and she has been trying to focus on diet and reduce portion sizing.  She is down 9 months from her last weight check and she feels like it is helping her.  She would like to go up on the dose because she feels over the past month or 2 she has plateaued a little bit.  Relevant past medical, surgical, family and social history reviewed and updated as indicated. Interim medical history since our last visit reviewed. Allergies and medications reviewed and updated.  Review of Systems  Constitutional:  Negative for chills and fever.  Eyes:  Negative for visual disturbance.  Respiratory:  Negative for chest tightness and shortness of breath.   Cardiovascular:  Negative for chest pain and leg swelling.  Genitourinary:  Negative for difficulty urinating and dysuria.  Musculoskeletal:  Negative for back pain and gait problem.  Skin:  Negative for rash.  Neurological:  Negative for dizziness, light-headedness and headaches.  Psychiatric/Behavioral:  Negative for agitation and behavioral problems.   All other systems reviewed and are negative.   Per HPI unless specifically indicated above   Allergies as of 05/31/2023       Reactions   Aspirin Nausea And Vomiting   Sulfa Antibiotics Hives        Medication List        Accurate as of May 31, 2023  1:47 PM. If you have any questions, ask your nurse or doctor.          STOP taking these medications    Wegovy 1.7 MG/0.75ML Soaj Generic drug: Semaglutide-Weight Management Replaced by: Wegovy 2.4  MG/0.75ML Soaj Stopped by: Elige Radon Annissa Andreoni       TAKE these medications    buPROPion 150 MG 24 hr tablet Commonly known as: Wellbutrin XL Take 1 tablet (150 mg total) by mouth daily.   levonorgestrel 20.1 MCG/DAY Iud IUD Commonly known as: LILETTA 1 each by Intrauterine route once.   Wegovy 2.4 MG/0.75ML Soaj Generic drug: Semaglutide-Weight Management Inject 2.4 mg into the skin once a week. Replaces: Wegovy 1.7 MG/0.75ML Soaj Started by: Elige Radon Alleen Kehm         Objective:   BP 123/77   Pulse 94   Ht 5\' 1"  (1.549 m)   Wt 237 lb (107.5 kg)   SpO2 98%   BMI 44.78 kg/m   Wt Readings from Last 3 Encounters:  05/31/23 237 lb (107.5 kg)  03/29/23 246 lb (111.6 kg)  01/25/23 261 lb 6.4 oz (118.6 kg)    Physical Exam Vitals and nursing note reviewed.  Constitutional:      General: She is not in acute distress.    Appearance: She is well-developed. She is not diaphoretic.  Eyes:     Conjunctiva/sclera: Conjunctivae normal.  Cardiovascular:     Rate and Rhythm: Normal rate and regular rhythm.     Heart sounds: Normal heart sounds. No murmur heard. Pulmonary:  Effort: Pulmonary effort is normal. No respiratory distress.     Breath sounds: Normal breath sounds. No wheezing, rhonchi or rales.  Musculoskeletal:        General: No tenderness. Normal range of motion.  Skin:    General: Skin is warm and dry.     Findings: No rash.  Neurological:     Mental Status: She is alert and oriented to person, place, and time.     Coordination: Coordination normal.  Psychiatric:        Behavior: Behavior normal.       Assessment & Plan:   Problem List Items Addressed This Visit       Other   Morbid obesity with BMI of 40.0-44.9, adult (HCC) - Primary   Relevant Medications   Semaglutide-Weight Management (WEGOVY) 2.4 MG/0.75ML SOAJ    Increase Wegovy to 2.4 mg weekly and continue to follow diet and exercise plan Follow up plan: Return in about 6 months  (around 11/28/2023), or if symptoms worsen or fail to improve, for Obesity and weight recheck.  Counseling provided for all of the vaccine components No orders of the defined types were placed in this encounter.   Arville Care, MD [facility] 05/31/2023, 1:47 PM

## 2023-06-04 ENCOUNTER — Other Ambulatory Visit (HOSPITAL_COMMUNITY): Payer: Self-pay

## 2023-06-04 ENCOUNTER — Telehealth: Payer: Self-pay

## 2023-06-04 ENCOUNTER — Encounter: Payer: Self-pay | Admitting: Family Medicine

## 2023-06-04 NOTE — Telephone Encounter (Signed)
 Pharmacy Patient Advocate Encounter  Received notification from [facility] that Prior Authorization for [facility] 2.4MG /0.75ML auto-injectors  has been APPROVED from 06/04/23 to 06/03/24. Ran test claim, Copay is $4. This test claim was processed through [facility]- copay amounts may vary at other pharmacies due to pharmacy/plan contracts, or as the patient moves through the different stages of their insurance plan.   PA #/Case ID/Reference #: [id number]

## 2023-06-04 NOTE — Telephone Encounter (Signed)
 Pharmacy Patient Advocate Encounter   Received notification from Pt Calls Messages that prior authorization for Wegovy 2.4MG /0.75ML auto-injectors is required/requested.   Insurance verification completed.   The patient is insured through [facility] .   Per test claim: PA required; PA submitted to above mentioned insurance via CoverMyMeds Key/confirmation #/EOC [id number] Status is pending

## 2023-06-04 NOTE — Telephone Encounter (Signed)
 Pt was seen in office with Dr. Louanne Skye on 05/31/23. She is wanting to check status of PA. Pt is out of her medication.

## 2023-06-04 NOTE — Telephone Encounter (Signed)
 PA request has been Submitted. New Encounter created for follow up. For additional info see Pharmacy Prior Auth telephone encounter from 06/04/23.

## 2023-06-05 ENCOUNTER — Other Ambulatory Visit (HOSPITAL_COMMUNITY): Payer: Self-pay

## 2023-06-06 ENCOUNTER — Ambulatory Visit: Payer: Medicaid Other | Admitting: Women's Health

## 2023-06-25 NOTE — Progress Notes (Deleted)
 Office Visit Note  Patient: Kristin Morgan             Date of Birth: April 05, 1996           MRN: [id number]             PCP: Dettinger, Elige Radon, MD Referring: Dettinger, Elige Radon, MD Visit Date: 07/09/2023 Occupation: @GUAROCC @  Subjective:  No chief complaint on file.   History of Present Illness: Kristin Morgan is a 28 y.o. female ***     Activities of Daily Living:  Patient reports morning stiffness for *** {minute/hour:19697}.   Patient {ACTIONS;DENIES/REPORTS:21021675::"Denies"} nocturnal pain.  Difficulty dressing/grooming: {ACTIONS;DENIES/REPORTS:21021675::"Denies"} Difficulty climbing stairs: {ACTIONS;DENIES/REPORTS:21021675::"Denies"} Difficulty getting out of chair: {ACTIONS;DENIES/REPORTS:21021675::"Denies"} Difficulty using hands for taps, buttons, cutlery, and/or writing: {ACTIONS;DENIES/REPORTS:21021675::"Denies"}  No Rheumatology ROS completed.   PMFS History:  Patient Active Problem List   Diagnosis Date Noted   Depression, recurrent (HCC) 03/29/2023   Encounter for initial prescription of intrauterine contraceptive device (IUD)    Morbid obesity with BMI of 40.0-44.9, adult (HCC) 07/30/2016    Past Medical History:  Diagnosis Date   Depression    Pregnant     Family History  Problem Relation Age of Onset   Hypertension Mother    Mental illness Mother    Hypertension Maternal Grandmother    Past Surgical History:  Procedure Laterality Date   LAPAROSCOPIC APPENDECTOMY N/A 04/22/2021   Procedure: APPENDECTOMY LAPAROSCOPIC;  Surgeon: Lucretia Roers, MD;  Location: [facility];  Service: General;  Laterality: N/A;   WISDOM TOOTH EXTRACTION  12/2018   Social History   Social History Narrative   Not on file   Immunization History  Administered Date(s) Administered   DTaP 12/27/1993, 02/22/1994, 05/31/1994   HIB (PRP-OMP) 12/27/1993, 02/22/1994, 05/31/1994   Hepatitis B 12/27/1993, 05/31/1994   IPV 12/27/1993, 02/22/1994, 05/31/1994    Influenza-Unspecified 03/24/2013   MMR 05/12/1998   Tdap 09/30/2013, 12/30/2015, 11/27/2019     Objective: Vital Signs: There were no vitals taken for this visit.   Physical Exam   Musculoskeletal Exam: ***  CDAI Exam: CDAI Score: -- Patient Global: --; Provider Global: -- Swollen: --; Tender: -- Joint Exam 07/09/2023   No joint exam has been documented for this visit   There is currently no information documented on the homunculus. Go to the Rheumatology activity and complete the homunculus joint exam.  Investigation: No additional findings.  Imaging: No results found.  Recent Labs: Lab Results  Component Value Date   WBC 8.1 09/05/2021   HGB 13.2 09/05/2021   PLT 328 09/05/2021   NA 135 08/17/2021   K 3.2 (L) 08/17/2021   CL 100 08/17/2021   CO2 24 08/17/2021   GLUCOSE 98 08/17/2021   BUN 10 08/17/2021   CREATININE 0.70 08/17/2021   BILITOT 0.9 08/17/2021   ALKPHOS 78 08/17/2021   AST 14 (L) 08/17/2021   ALT 15 08/17/2021   PROT 7.5 08/17/2021   ALBUMIN 3.9 08/17/2021   CALCIUM 8.6 (L) 08/17/2021   January 25, 2023 RNP 6.1, dsDNA negative, Smith negative, SCL 70 negative, SSA negative, SSB negative, chromatin antibody negative, Jo 1 negative, centromere negative, TSH normal, vitamin D 34  Speciality Comments: No specialty comments available.  Procedures:  No procedures performed Allergies: Aspirin and Sulfa antibiotics   Assessment / Plan:     Visit Diagnoses: No diagnosis found.  Orders: No orders of the defined types were placed in this encounter.  No orders of the defined types were placed in this encounter.  Face-to-face time spent with patient was *** minutes. Greater than 50% of time was spent in counseling and coordination of care.  Follow-Up Instructions: No follow-ups on file.   Pollyann Savoy, MD  Note - This record has been created using Animal nutritionist.  Chart creation errors have been sought, but may not always  have been  located. Such creation errors do not reflect on  the standard of medical care.

## 2023-06-27 ENCOUNTER — Ambulatory Visit: Payer: Medicaid Other | Admitting: Family Medicine

## 2023-07-09 ENCOUNTER — Encounter: Payer: Medicaid Other | Admitting: Rheumatology

## 2023-07-09 DIAGNOSIS — F339 Major depressive disorder, recurrent, unspecified: Secondary | ICD-10-CM

## 2023-07-09 DIAGNOSIS — R768 Other specified abnormal immunological findings in serum: Secondary | ICD-10-CM

## 2023-07-09 DIAGNOSIS — M2550 Pain in unspecified joint: Secondary | ICD-10-CM

## 2023-07-25 ENCOUNTER — Other Ambulatory Visit: Payer: Self-pay | Admitting: Family Medicine

## 2023-07-25 DIAGNOSIS — F339 Major depressive disorder, recurrent, unspecified: Secondary | ICD-10-CM

## 2023-08-07 ENCOUNTER — Ambulatory Visit: Payer: Medicaid Other | Admitting: Rheumatology

## 2023-08-20 ENCOUNTER — Other Ambulatory Visit: Payer: Self-pay | Admitting: Family Medicine

## 2023-08-20 DIAGNOSIS — F339 Major depressive disorder, recurrent, unspecified: Secondary | ICD-10-CM

## 2023-09-14 DIAGNOSIS — H9201 Otalgia, right ear: Secondary | ICD-10-CM | POA: Diagnosis not present

## 2023-09-20 ENCOUNTER — Ambulatory Visit: Admitting: Family Medicine

## 2023-09-20 VITALS — BP 120/86 | HR 93 | Ht 61.0 in | Wt 219.2 lb

## 2023-09-20 DIAGNOSIS — F339 Major depressive disorder, recurrent, unspecified: Secondary | ICD-10-CM

## 2023-09-20 MED ORDER — BUPROPION HCL ER (XL) 300 MG PO TB24: 300.0000 mg | ORAL_TABLET | Freq: Every day | ORAL | 1 refills | Status: DC

## 2023-09-20 NOTE — Progress Notes (Signed)
 BP 120/86   Pulse 93   Ht 5' 1 (1.549 m)   Wt 219 lb 3.2 oz (99.4 kg)   SpO2 95%   BMI 41.42 kg/m    Subjective:   Patient ID: Kristin Morgan, female    DOB: October 16, 1995, 28 y.o.   MRN: [id number]  HPI: Kristin Morgan is a 28 y.o. female presenting on 09/20/2023 for Anxiety   HPI Anxiety Patient is coming in for anxiety recheck and currently taking Wellbutrin .  Patient feels like she has been more tired a lot recently decreased energy and more irritable.  She just feels like her Wellbutrin  is not working as well as it was initially at this dose.  She denies any suicidal ideations or thoughts of hurting herself.  She has decreased energy and decreased interest and decreased motivation and things as well.  She feels like she is sleeping well but feels like her sleep is nonrefreshing as well.    09/20/2023    9:24 AM 05/31/2023    1:25 PM 03/29/2023    2:57 PM 03/29/2023    2:56 PM 12/28/2022    4:12 PM  Depression screen PHQ 2/9  Decreased Interest 1 0 0 0 1  Down, Depressed, Hopeless 2 0 0 0 1  PHQ - 2 Score 3 0 0 0 2  Altered sleeping 2  1  0  Tired, decreased energy 0  1  0  Change in appetite 0  0  0  Feeling bad or failure about yourself  0  1  1  Trouble concentrating 0  1  0  Moving slowly or fidgety/restless 0  0  0  Suicidal thoughts 0  0  0  PHQ-9 Score 5  4  3   Difficult doing work/chores Not difficult at all  Not difficult at all  Somewhat difficult     Relevant past medical, surgical, family and social history reviewed and updated as indicated. Interim medical history since our last visit reviewed. Allergies and medications reviewed and updated.  Review of Systems  Constitutional:  Negative for chills and fever.  Eyes:  Negative for visual disturbance.  Respiratory:  Negative for chest tightness and shortness of breath.   Cardiovascular:  Negative for chest pain and leg swelling.  Genitourinary:  Negative for difficulty urinating and dysuria.  Musculoskeletal:   Negative for back pain and gait problem.  Skin:  Negative for rash.  Neurological:  Negative for dizziness, light-headedness and headaches.  Psychiatric/Behavioral:  Positive for dysphoric mood and sleep disturbance. Negative for agitation, behavioral problems, self-injury and suicidal ideas. The patient is nervous/anxious.   All other systems reviewed and are negative.   Per HPI unless specifically indicated above   Allergies as of 09/20/2023       Reactions   Aspirin Nausea And Vomiting   Sulfa Antibiotics Hives        Medication List        Accurate as of September 20, 2023  9:42 AM. If you have any questions, ask your nurse or doctor.          buPROPion  300 MG 24 hr tablet Commonly known as: Wellbutrin  XL Take 1 tablet (300 mg total) by mouth daily. What changed:  medication strength how much to take Changed by: Lucio Sabin Eliot Bencivenga   levonorgestrel  20.1 MCG/DAY Iud IUD Commonly known as: LILETTA  1 each by Intrauterine route once.   Wegovy  2.4 MG/0.75ML Soaj Generic drug: Semaglutide -Weight Management Inject 2.4 mg into the skin once a  week.         Objective:   BP 120/86   Pulse 93   Ht 5' 1 (1.549 m)   Wt 219 lb 3.2 oz (99.4 kg)   SpO2 95%   BMI 41.42 kg/m   Wt Readings from Last 3 Encounters:  09/20/23 219 lb 3.2 oz (99.4 kg)  05/31/23 237 lb (107.5 kg)  03/29/23 246 lb (111.6 kg)    Physical Exam Vitals and nursing note reviewed.  Constitutional:      General: She is not in acute distress.    Appearance: She is well-developed. She is not diaphoretic.   Eyes:     Conjunctiva/sclera: Conjunctivae normal.    Musculoskeletal:        General: No swelling.   Skin:    General: Skin is warm and dry.     Findings: No rash.   Neurological:     Mental Status: She is alert and oriented to person, place, and time.     Coordination: Coordination normal.   Psychiatric:        Behavior: Behavior normal.       Assessment & Plan:   Problem  List Items Addressed This Visit       Other   Depression, recurrent (HCC) - Primary   Relevant Medications   buPROPion  (WELLBUTRIN  XL) 300 MG 24 hr tablet    Increase Wellbutrin  to 300 mg daily and will follow-up in a month or 2. Follow up plan: Return if symptoms worsen or fail to improve, for depression.  Counseling provided for all of the vaccine components No orders of the defined types were placed in this encounter.   Jolyne Needs, MD [facility] 09/20/2023, 9:42 AM

## 2023-10-14 ENCOUNTER — Other Ambulatory Visit: Payer: Self-pay | Admitting: Family Medicine

## 2023-10-15 ENCOUNTER — Telehealth: Admitting: Family Medicine

## 2023-10-15 ENCOUNTER — Ambulatory Visit: Payer: Self-pay

## 2023-10-15 DIAGNOSIS — K529 Noninfective gastroenteritis and colitis, unspecified: Secondary | ICD-10-CM | POA: Diagnosis not present

## 2023-10-15 MED ORDER — ONDANSETRON 4 MG PO TBDP: 4.0000 mg | ORAL_TABLET | Freq: Three times a day (TID) | ORAL | 0 refills | Status: AC | PRN

## 2023-10-15 NOTE — Patient Instructions (Signed)
 Kristin Morgan, thank you for joining Chiquita CHRISTELLA Barefoot, NP for today's virtual visit.  While this provider is not your primary care provider (PCP), if your PCP is located in our provider database this encounter information will be shared with them immediately following your visit.   A [facility] MyChart account gives you access to today's visit and all your visits, tests, and labs performed at [facility]  click here if you don't have a [facility] MyChart account or go to [email or web address]  Consent: (Patient) Kristin Morgan provided verbal consent for this virtual visit at the beginning of the encounter.  Current Medications:  Current Outpatient Medications:    ondansetron  (ZOFRAN -ODT) 4 MG disintegrating tablet, Take 1 tablet (4 mg total) by mouth every 8 (eight) hours as needed for nausea or vomiting., Disp: 20 tablet, Rfl: 0   buPROPion  (WELLBUTRIN  XL) 300 MG 24 hr tablet, Take 1 tablet (300 mg total) by mouth daily., Disp: 90 tablet, Rfl: 1   levonorgestrel  (LILETTA ) 20.1 MCG/DAY IUD, 1 each by Intrauterine route once., Disp: , Rfl:    Semaglutide -Weight Management (WEGOVY ) 2.4 MG/0.75ML SOAJ, INJECT 2.4 MG INTO THE SKIN ONCE A WEEK FOR 28 DAYS., Disp: 3 mL, Rfl: 3   Medications ordered in this encounter:  Meds ordered this encounter  Medications   ondansetron  (ZOFRAN -ODT) 4 MG disintegrating tablet    Sig: Take 1 tablet (4 mg total) by mouth every 8 (eight) hours as needed for nausea or vomiting.    Dispense:  20 tablet    Refill:  0    Supervising Provider:   LAMPTEY, PHILIP O [[id number]]     *If you need refills on other medications prior to your next appointment, please contact your pharmacy*  Follow-Up: Call back or seek an in-person evaluation if the symptoms worsen or if the condition fails to improve as anticipated.  [facility] [phone]  Other Instructions  .First 24 Hours-Clear liquids             popsicles             Jello              gatorade             Sprite Ginger ale Second 24 hours-Add Full liquids ( Liquids you can not see through) Third 24 hours- Bland diet ( foods that are baked or broiled)             *avoiding fried foods and highly spiced foods* During these 3 days             Avoid milk, cheese, ice cream or any other dairy products             Avoid caffeine- REMEMBER most soft drinks and sodas have caffeine which can upset your stomach. You should eat and drink in frequent small volumes Immodium AD OTC for diarrhea If no improvement in symptoms or worsen in 2-3 days you need to be seen in person.              IF ABDOMINAL PAIN WORSENS- you will need a face to face.    If you have been instructed to have an in-person evaluation today at a local Urgent Care facility, please use the link below. It will take you to a list of all of our available [facility], including address, phone number and hours of operation. Please do not delay care.  [facility]  If you or a family member do not have a primary care provider, use the link below to schedule a visit and establish care. When you choose a [facility] primary care physician or advanced practice provider, you gain a long-term partner in health. Find a Primary Care Provider  Learn more about [facility]'s in-office and virtual care options:  - Get Care Now

## 2023-10-15 NOTE — Telephone Encounter (Signed)
 Reason for Disposition  [1] MILD or MODERATE vomiting AND [2] present > 48 hours (2 days) (Exception: Mild vomiting with associated diarrhea.)  Answer Assessment - Initial Assessment Questions 1. VOMITING SEVERITY: How many times have you vomited in the past 24 hours?     - MILD:  1 - 2 times/day    - MODERATE: 3 - 5 times/day, decreased oral intake without significant weight loss or symptoms of dehydration    - SEVERE: 6 or more times/day, vomits everything or nearly everything, with significant weight loss, symptoms of dehydration      5 2. ONSET: When did the vomiting begin?      Yesterday AM 3. FLUIDS: What fluids or food have you vomited up today? Have you been able to keep any fluids down?     yes 4. ABDOMEN PAIN: Are your having any abdomen pain? If Yes : How bad is it and what does it feel like? (e.g., crampy, dull, intermittent, constant)      Stomach hurts 5. DIARRHEA: Is there any diarrhea? If Yes, ask: How many times today?      no 6. CONTACTS: Is there anyone else in the family with the same symptoms?      yes 7. CAUSE: What do you think is causing your vomiting?     Stomach bug 8. HYDRATION STATUS: Any signs of dehydration? (e.g., dry mouth [not only dry lips], too weak to stand) When did you last urinate?     denies 9. OTHER SYMPTOMS: Do you have any other symptoms? (e.g., fever, headache, vertigo, vomiting blood or coffee grounds, recent head injury)     Feverish  10. PREGNANCY: Is there any chance you are pregnant? When was your last menstrual period?       Na    Pt feels this is stomach bug. Friends have same symptoms. Pt declined office visit and asked to be seen virtually. Virtual UC appt set up for 1115 this morning.  Protocols used: Vomiting-A-AH

## 2023-10-15 NOTE — Progress Notes (Signed)
 Virtual Visit Consent   Kristin Morgan, you are scheduled for a virtual visit with a [facility] provider today. Just as with appointments in the office, your consent must be obtained to participate. Your consent will be active for this visit and any virtual visit you may have with one of our providers in the next 365 days. If you have a MyChart account, a copy of this consent can be sent to you electronically.  As this is a virtual visit, video technology does not allow for your provider to perform a traditional examination. This may limit your provider's ability to fully assess your condition. If your provider identifies any concerns that need to be evaluated in person or the need to arrange testing (such as labs, EKG, etc.), we will make arrangements to do so. Although advances in technology are sophisticated, we cannot ensure that it will always work on either your end or our end. If the connection with a video visit is poor, the visit may have to be switched to a telephone visit. With either a video or telephone visit, we are not always able to ensure that we have a secure connection.  By engaging in this virtual visit, you consent to the provision of healthcare and authorize for your insurance to be billed (if applicable) for the services provided during this visit. Depending on your insurance coverage, you may receive a charge related to this service.  I need to obtain your verbal consent now. Are you willing to proceed with your visit today? Kristin Morgan has provided verbal consent on 10/15/2023 for a virtual visit (video or telephone). Kristin CHRISTELLA Barefoot, NP  Date: 10/15/2023 11:13 AM   Virtual Visit via Video Note   I, Kristin Morgan, connected with  Kristin Morgan  ([id number], Aug 22, 1995) on 10/15/23 at 11:15 AM EDT by a video-enabled telemedicine application and verified that I am speaking with the correct person using two identifiers.  Location: Patient: Virtual Visit Location Patient: Home Provider:  Virtual Visit Location Provider: Home Office   I discussed the limitations of evaluation and management by telemedicine and the availability of in person appointments. The patient expressed understanding and agreed to proceed.    History of Present Illness: Kristin Morgan is a 28 y.o. who identifies as a female who was assigned female at birth, and is being seen today for nausea and vomiting  Onset was yesterday morning Associated symptoms are stomach pain and vomiting 1-5 times daily Tolerating fluids, making urine, feeling feverish - has not taken temp.  Modifying factors are Pepto Denies chest pain, shortness of breath, fevers, chills, dehydration   Exposure to sick contacts- known- family and friends sick with stomach bug   Problems:  Patient Active Problem List   Diagnosis Date Noted   Depression, recurrent (HCC) 03/29/2023   Encounter for initial prescription of intrauterine contraceptive device (IUD)    Morbid obesity with BMI of 40.0-44.9, adult (HCC) 07/30/2016    Allergies:  Allergies  Allergen Reactions   Aspirin Nausea And Vomiting   Sulfa Antibiotics Hives   Medications:  Current Outpatient Medications:    buPROPion  (WELLBUTRIN  XL) 300 MG 24 hr tablet, Take 1 tablet (300 mg total) by mouth daily., Disp: 90 tablet, Rfl: 1   levonorgestrel  (LILETTA ) 20.1 MCG/DAY IUD, 1 each by Intrauterine route once., Disp: , Rfl:    Semaglutide -Weight Management (WEGOVY ) 2.4 MG/0.75ML SOAJ, INJECT 2.4 MG INTO THE SKIN ONCE A WEEK FOR 28 DAYS., Disp: 3 mL, Rfl: 3  Observations/Objective: Patient  is well-developed, well-nourished in no acute distress.  Resting comfortably  at home.  Head is normocephalic, atraumatic.  No labored breathing.  Speech is clear and coherent with logical content.  Patient is alert and oriented at baseline.    Assessment and Plan:   1. Gastroenteritis (Primary)  - ondansetron  (ZOFRAN -ODT) 4 MG disintegrating tablet; Take 1 tablet (4 mg total) by mouth  every 8 (eight) hours as needed for nausea or vomiting.  Dispense: 20 tablet; Refill: 0  First 24 Hours-Clear liquids             popsicles             Jello             gatorade             Sprite Ginger ale Second 24 hours-Add Full liquids ( Liquids you can not see through) Third 24 hours- Bland diet ( foods that are baked or broiled)             *avoiding fried foods and highly spiced foods* During these 3 days             Avoid milk, cheese, ice cream or any other dairy products             Avoid caffeine- REMEMBER most soft drinks and sodas have caffeine which can upset your stomach. You should eat and drink in frequent small volumes Immodium AD OTC for diarrhea If no improvement in symptoms or worsen in 2-3 days you need to be seen in person.              IF ABDOMINAL PAIN WORSENS- you will need a face to face.    Reviewed side effects, risks and benefits of medication.    Patient acknowledged agreement and understanding of the plan.   Past Medical, Surgical, Social History, Allergies, and Medications have been Reviewed.   Follow Up Instructions: I discussed the assessment and treatment plan with the patient. The patient was provided an opportunity to ask questions and all were answered. The patient agreed with the plan and demonstrated an understanding of the instructions.  A copy of instructions were sent to the patient via MyChart unless otherwise noted below.    The patient was advised to call back or seek an in-person evaluation if the symptoms worsen or if the condition fails to improve as anticipated.    Kristin CHRISTELLA Barefoot, NP

## 2023-10-15 NOTE — Telephone Encounter (Signed)
 E2C2 scheduled appointment.

## 2023-10-15 NOTE — Telephone Encounter (Signed)
 FYI Only or Action Required?: FYI only for provider.  Patient was last seen in primary care on 09/20/2023 by Dettinger, Fonda LABOR, MD.  Called Nurse Triage reporting Vomiting.  Symptoms began yesterday.  Interventions attempted: Nothing.  Symptoms are: unchanged.  Triage Disposition: See Physician Within 24 Hours  Patient/caregiver understands and will follow disposition?: Yes

## 2023-10-16 ENCOUNTER — Encounter: Payer: Self-pay | Admitting: Family Medicine

## 2023-10-16 ENCOUNTER — Ambulatory Visit: Admitting: Family Medicine

## 2023-10-16 VITALS — BP 126/84 | HR 74 | Temp 97.0°F | Ht 61.0 in | Wt 214.0 lb

## 2023-10-16 DIAGNOSIS — A09 Infectious gastroenteritis and colitis, unspecified: Secondary | ICD-10-CM

## 2023-10-16 NOTE — Progress Notes (Signed)
 Acute Office Visit  Subjective:     Patient ID: Kristin Morgan, female    DOB: 01/31/1996, 28 y.o.   MRN: [id number]  Chief Complaint  Patient presents with   Nausea    And vomiting. With diarrhea and fever. Started Monday this week.    HPI Kristin Morgan is a 28 y.o. female who is present to the clinic with a chief complain of persistent nausea, vomiting, stomach ache, diarrhea, and fever. The symptoms began two days ago and have been worsening ever since. The abdominal pain is localized to upper abdomen and it occasionally radiates to lower abdomen. She describes the pain as a constant cramp and pressure like feeling. The symptoms are always present and it gets aggravated by food or drink. She has not been able to keep food down, however she has been trying to stay hydrated by drinking water and other liquid products. She mentions that acetaminophen , zofran  and pepcid has not provided significant relief. She mentions that her husband has also been nauseated, but she does not recall eating anything unusual or having a sick contact before onset of symptoms. She states that the symptoms are very severe and it is affecting her ability to do daily chores and work. She denies any urinary changes, blood in stool, unexpected weight loss, or flank pain. She also reports that she did not have any side effects with Wegovy  and does not associate current symptoms with it.   Review of Systems  Constitutional:  Positive for chills, fever and malaise/fatigue. Negative for weight loss.  HENT:  Negative for congestion, hearing loss and sore throat.   Eyes:  Negative for blurred vision.  Respiratory:  Negative for cough, sputum production and shortness of breath.   Cardiovascular:  Negative for chest pain, palpitations and leg swelling.  Gastrointestinal:  Positive for abdominal pain, diarrhea, nausea and vomiting. Negative for blood in stool, constipation and heartburn.  Genitourinary:  Negative for dysuria,  frequency, hematuria and urgency.  Musculoskeletal:  Positive for myalgias. Negative for joint pain.  Skin:  Negative for itching and rash.  Neurological:  Positive for headaches. Negative for dizziness and focal weakness.        Objective:    BP 126/84   Pulse 74   Temp (!) 97 F (36.1 C)   Ht 5' 1 (1.549 m)   Wt 214 lb (97.1 kg)   SpO2 95%   BMI 40.43 kg/m    Physical Exam Constitutional:      General: She is in acute distress.     Appearance: Normal appearance.  HENT:     Head: Normocephalic and atraumatic.  Cardiovascular:     Rate and Rhythm: Normal rate and regular rhythm.     Pulses: Normal pulses.     Heart sounds: Normal heart sounds. No murmur heard. Pulmonary:     Effort: Pulmonary effort is normal. No respiratory distress.     Breath sounds: Normal breath sounds.  Abdominal:     General: Abdomen is flat. Bowel sounds are normal. There is no distension.     Palpations: Abdomen is soft. There is no mass.     Tenderness: There is abdominal tenderness (Generalized tenderness to deep palpation.). There is no guarding or rebound.     Hernia: No hernia is present.  Neurological:     Mental Status: She is alert and oriented to person, place, and time.  Psychiatric:        Mood and Affect: Mood normal.  No results found for any visits on 10/16/23.      Assessment & Plan:    (1) Abdominal Pain with nausea, vomiting, diarrhea, and fever.  - Assessment: Likely viral gastroenteritis given the acute onset, symptoms, and history. Pathologies like cholecystitis, choledocholithiasis, UTI, and pyelonephritis less likely due to lack of red-flag symptoms.  - Plan: Take Zofran  every 8 hours to help reduce nausea and vomiting. Advised to take acetaminophen  as needed for fever and body aches, avoid ibuprofen  due to potential for stomach irritation. Encouraged to hydrate well and consume non-fatty and spice-less food. Counseled about hygiene practice to reduce the  chances of transmitting the virus to other family members. RTC if symptoms fail to improve or worsen.   Dotty Blanch, Medical Student  [employer]  at [facility] 10/16/23 10:22 AM   I was personally present for all components of the history, physical exam and/or medical decision making.  I agree with the documentation performed by the student and agree with assessment and plan above.  Fonda Levins, MD [facility] 10/23/2023, 3:03 PM

## 2023-11-06 ENCOUNTER — Encounter: Payer: Self-pay | Admitting: Family Medicine

## 2023-11-06 ENCOUNTER — Ambulatory Visit (INDEPENDENT_AMBULATORY_CARE_PROVIDER_SITE_OTHER): Admitting: Family Medicine

## 2023-11-06 VITALS — BP 99/76 | HR 87 | Temp 97.5°F | Ht 61.0 in | Wt 210.8 lb

## 2023-11-06 DIAGNOSIS — F339 Major depressive disorder, recurrent, unspecified: Secondary | ICD-10-CM | POA: Diagnosis not present

## 2023-11-06 MED ORDER — BUSPIRONE HCL 10 MG PO TABS: 10.0000 mg | ORAL_TABLET | Freq: Two times a day (BID) | ORAL | 1 refills | Status: DC

## 2023-11-06 NOTE — Progress Notes (Signed)
 BP 99/76   Pulse 87   Temp (!) 97.5 F (36.4 C)   Ht 5' 1 (1.549 m)   Wt 210 lb 12.8 oz (95.6 kg)   SpO2 99%   BMI 39.83 kg/m    Subjective:   Patient ID: Kristin Morgan, female    DOB: 1995-12-27, 28 y.o.   MRN: [id number]  HPI: Kristin Morgan is a 28 y.o. female presenting on 11/06/2023 for Anxiety   HPI Anxiety and depression recheck Patient is coming in today for anxiety recheck.  She is currently taking Wellbutrin  300.  Patient is coming in today for recheck of anxiety.  She said initially when we increased the Wellbutrin  it helped a lot but now she feels like her anxiety is not doing well and she is very irritable.  She denies any suicidal ideations or thoughts of hurting herself.    11/06/2023    8:41 AM 10/16/2023    9:40 AM 09/20/2023    9:24 AM 05/31/2023    1:25 PM 03/29/2023    2:57 PM  Depression screen PHQ 2/9  Decreased Interest 2 1 1  0 0  Down, Depressed, Hopeless 2 2 2  0 0  PHQ - 2 Score 4 3 3  0 0  Altered sleeping 1 2 2  1   Tired, decreased energy 2 0 0  1  Change in appetite 0 0 0  0  Feeling bad or failure about yourself  0 0 0  1  Trouble concentrating 0 0 0  1  Moving slowly or fidgety/restless 0 0 0  0  Suicidal thoughts 0 0 0  0  PHQ-9 Score 7 5 5  4   Difficult doing work/chores Not difficult at all Not difficult at all Not difficult at all  Not difficult at all     Relevant past medical, surgical, family and social history reviewed and updated as indicated. Interim medical history since our last visit reviewed. Allergies and medications reviewed and updated.  Review of Systems  Constitutional:  Negative for chills and fever.  Eyes:  Negative for redness and visual disturbance.  Respiratory:  Negative for chest tightness and shortness of breath.   Cardiovascular:  Negative for chest pain and leg swelling.  Skin:  Negative for rash.  Neurological:  Negative for dizziness, light-headedness and headaches.  Psychiatric/Behavioral:  Positive for dysphoric  mood. Negative for agitation, behavioral problems, self-injury, sleep disturbance and suicidal ideas. The patient is nervous/anxious.   All other systems reviewed and are negative.   Per HPI unless specifically indicated above   Allergies as of 11/06/2023       Reactions   Aspirin Nausea And Vomiting   Sulfa Antibiotics Hives        Medication List        Accurate as of November 06, 2023  9:14 AM. If you have any questions, ask your nurse or doctor.          buPROPion  300 MG 24 hr tablet Commonly known as: Wellbutrin  XL Take 1 tablet (300 mg total) by mouth daily.   busPIRone  10 MG tablet Commonly known as: BUSPAR  Take 1 tablet (10 mg total) by mouth 2 (two) times daily. Started by: Fonda LABOR Jeanice Dempsey   levonorgestrel  20.1 MCG/DAY Iud IUD Commonly known as: LILETTA  1 each by Intrauterine route once.   ondansetron  4 MG disintegrating tablet Commonly known as: ZOFRAN -ODT Take 1 tablet (4 mg total) by mouth every 8 (eight) hours as needed for nausea or vomiting.   Wegovy   2.4 MG/0.75ML Soaj Generic drug: Semaglutide -Weight Management INJECT 2.4 MG INTO THE SKIN ONCE A WEEK FOR 28 DAYS.         Objective:   BP 99/76   Pulse 87   Temp (!) 97.5 F (36.4 C)   Ht 5' 1 (1.549 m)   Wt 210 lb 12.8 oz (95.6 kg)   SpO2 99%   BMI 39.83 kg/m   Wt Readings from Last 3 Encounters:  11/06/23 210 lb 12.8 oz (95.6 kg)  10/16/23 214 lb (97.1 kg)  09/20/23 219 lb 3.2 oz (99.4 kg)    Physical Exam Vitals and nursing note reviewed.  Constitutional:      General: She is not in acute distress.    Appearance: She is well-developed. She is not diaphoretic.  Eyes:     Conjunctiva/sclera: Conjunctivae normal.  Cardiovascular:     Rate and Rhythm: Normal rate and regular rhythm.     Heart sounds: Normal heart sounds. No murmur heard. Pulmonary:     Effort: Pulmonary effort is normal. No respiratory distress.     Breath sounds: Normal breath sounds. No wheezing.  Skin:     General: Skin is warm and dry.     Findings: No rash.  Neurological:     Mental Status: She is alert and oriented to person, place, and time.     Coordination: Coordination normal.  Psychiatric:        Mood and Affect: Mood is anxious and depressed.        Behavior: Behavior normal.        Thought Content: Thought content does not include suicidal ideation. Thought content does not include suicidal plan.       Assessment & Plan:   Problem List Items Addressed This Visit       Other   Depression, recurrent (HCC) - Primary   Relevant Medications   busPIRone  (BUSPAR ) 10 MG tablet    Will add buspirone  to the Wellbutrin  to help her more with anxiety and depression.  Follow-up in 1 to 2 months.  See if she is doing better then. Follow up plan: Return if symptoms worsen or fail to improve, for 1 to 17-month follow-up for anxiety..  Counseling provided for all of the vaccine components No orders of the defined types were placed in this encounter.   Fonda Levins, MD [facility] 11/06/2023, 9:14 AM

## 2023-11-27 ENCOUNTER — Ambulatory Visit: Payer: Medicaid Other | Admitting: Family Medicine

## 2023-11-29 ENCOUNTER — Other Ambulatory Visit: Payer: Self-pay | Admitting: Family Medicine

## 2023-11-29 DIAGNOSIS — F339 Major depressive disorder, recurrent, unspecified: Secondary | ICD-10-CM

## 2023-12-19 ENCOUNTER — Encounter: Payer: Self-pay | Admitting: Family Medicine

## 2023-12-19 ENCOUNTER — Ambulatory Visit: Admitting: Family Medicine

## 2023-12-19 VITALS — BP 109/79 | HR 85 | Ht 61.0 in | Wt 207.0 lb

## 2023-12-19 DIAGNOSIS — Z6841 Body Mass Index (BMI) 40.0 and over, adult: Secondary | ICD-10-CM

## 2023-12-19 NOTE — Progress Notes (Signed)
 BP 109/79   Pulse 85   Ht 5' 1 (1.549 m)   Wt 207 lb (93.9 kg)   SpO2 97%   BMI 39.11 kg/m    Subjective:   Patient ID: Kristin Morgan, female    DOB: 11/01/1995, 28 y.o.   MRN: [id number]  HPI: Kristin Morgan is a 28 y.o. female presenting on 12/19/2023 for Medical Management of Chronic Issues and Obesity   Discussed the use of AI scribe software for clinical note transcription with the patient, who gave verbal consent to proceed.  History of Present Illness         Patient is coming in today for obesity and weight loss management.  She has continued to come down and is down another 4 Morgan from previous she takes the Wegovy  for this and except for some mild constipation she has been doing pretty well on the medicine is very satisfied with it and feels like it is helping her going the right direction.     Relevant past medical, surgical, family and social history reviewed and updated as indicated. Interim medical history since our last visit reviewed. Allergies and medications reviewed and updated.  Review of Systems  Constitutional:  Negative for chills and fever.  HENT:  Negative for congestion, ear discharge and ear pain.   Eyes:  Negative for redness and visual disturbance.  Respiratory:  Negative for chest tightness and shortness of breath.   Cardiovascular:  Negative for chest pain and leg swelling.  Genitourinary:  Negative for difficulty urinating and dysuria.  Musculoskeletal:  Negative for back pain and gait problem.  Skin:  Negative for rash.  Neurological:  Negative for dizziness, light-headedness and headaches.  Psychiatric/Behavioral:  Negative for agitation and behavioral problems.   All other systems reviewed and are negative.   Per HPI unless specifically indicated above   Allergies as of 12/19/2023       Reactions   Aspirin Nausea And Vomiting   Sulfa Antibiotics Hives        Medication List        Accurate as of December 19, 2023  8:25 AM. If  you have any questions, ask your nurse or doctor.          buPROPion  300 MG 24 hr tablet Commonly known as: Wellbutrin  XL Take 1 tablet (300 mg total) by mouth daily.   busPIRone  10 MG tablet Commonly known as: BUSPAR  TAKE 1 TABLET BY MOUTH TWICE A DAY   levonorgestrel  20.1 MCG/DAY Iud IUD Commonly known as: LILETTA  1 each by Intrauterine route once.   ondansetron  4 MG disintegrating tablet Commonly known as: ZOFRAN -ODT Take 1 tablet (4 mg total) by mouth every 8 (eight) hours as needed for nausea or vomiting.   Wegovy  2.4 MG/0.75ML Soaj SQ injection Generic drug: semaglutide -weight management INJECT 2.4 MG INTO THE SKIN ONCE A WEEK FOR 28 DAYS.         Objective:   BP 109/79   Pulse 85   Ht 5' 1 (1.549 m)   Wt 207 lb (93.9 kg)   SpO2 97%   BMI 39.11 kg/m   Wt Readings from Last 3 Encounters:  12/19/23 207 lb (93.9 kg)  11/06/23 210 lb 12.8 oz (95.6 kg)  10/16/23 214 lb (97.1 kg)    Physical Exam Vitals and nursing note reviewed.  Constitutional:      General: She is not in acute distress.    Appearance: She is well-developed. She is not diaphoretic.  Eyes:  Conjunctiva/sclera: Conjunctivae normal.     Pupils: Pupils are equal, round, and reactive to light.  Cardiovascular:     Rate and Rhythm: Normal rate and regular rhythm.     Heart sounds: Normal heart sounds. No murmur heard. Pulmonary:     Effort: Pulmonary effort is normal. No respiratory distress.     Breath sounds: Normal breath sounds. No wheezing.  Musculoskeletal:        General: No tenderness. Normal range of motion.  Skin:    General: Skin is warm and dry.     Findings: No rash.  Neurological:     Mental Status: She is alert and oriented to person, place, and time.     Coordination: Coordination normal.  Psychiatric:        Behavior: Behavior normal.    Physical Exam             Assessment & Plan:   Problem List Items Addressed This Visit       Other   Morbid obesity  with BMI of 40.0-44.9, adult (HCC) - Primary             Continue Wegovy , seems to be doing well, follow-up in 3 months     Follow up plan: Return in about 3 months (around 03/19/2024), or if symptoms worsen or fail to improve, for Weight recheck and obesity.  Counseling provided for all of the vaccine components No orders of the defined types were placed in this encounter.   Fonda Levins, MD [facility] 12/19/2023, 8:25 AM

## 2023-12-24 ENCOUNTER — Encounter: Admitting: Obstetrics & Gynecology

## 2023-12-29 ENCOUNTER — Other Ambulatory Visit: Payer: Self-pay | Admitting: Family Medicine

## 2023-12-29 DIAGNOSIS — F339 Major depressive disorder, recurrent, unspecified: Secondary | ICD-10-CM

## 2024-01-08 ENCOUNTER — Encounter: Payer: Self-pay | Admitting: Family Medicine

## 2024-01-08 ENCOUNTER — Telehealth: Admitting: Family Medicine

## 2024-01-08 ENCOUNTER — Ambulatory Visit

## 2024-01-08 DIAGNOSIS — N3000 Acute cystitis without hematuria: Secondary | ICD-10-CM | POA: Diagnosis not present

## 2024-01-08 LAB — URINALYSIS, ROUTINE W REFLEX MICROSCOPIC
Nitrite, UA: POSITIVE — AB
Specific Gravity, UA: 1.02 (ref 1.005–1.030)
Urobilinogen, Ur: 2 mg/dL — ABNORMAL HIGH (ref 0.2–1.0)
pH, UA: 6.5 (ref 5.0–7.5)

## 2024-01-08 LAB — MICROSCOPIC EXAMINATION
Epithelial Cells (non renal): NONE SEEN /HPF (ref 0–10)
RBC, Urine: >30 /HPF — AB (ref 0–2)
Renal Epithel, UA: NONE SEEN /HPF
WBC, UA: >30 /HPF — AB (ref 0–5)

## 2024-01-08 MED ORDER — NITROFURANTOIN MONOHYD MACRO 100 MG PO CAPS: 100.0000 mg | ORAL_CAPSULE | Freq: Two times a day (BID) | ORAL | 0 refills | Status: AC

## 2024-01-08 MED ORDER — FLUCONAZOLE 150 MG PO TABS: 150.0000 mg | ORAL_TABLET | Freq: Once | ORAL | 0 refills | Status: AC

## 2024-01-08 NOTE — Progress Notes (Signed)
 MyChart Video visit  Subjective: CC: Suspected UTI PCP: Dettinger, Fonda LABOR, MD YEP:Kristin Morgan is a 28 y.o. female. Patient provides verbal consent for consult held via video.  Due to COVID-19 pandemic this visit was conducted virtually. This visit type was conducted due to national recommendations for restrictions regarding the COVID-19 Pandemic (e.g. social distancing, sheltering in place) in an effort to limit this patient's exposure and mitigate transmission in our community. All issues noted in this document were discussed and addressed.  A physical exam was not performed with this format.   Location of patient: home Location of provider: [facility] Others present for call: child  1.  Suspected UTI She reports is a 3 to 4-day history of urgency and pressure.  She denies any vaginal discharge, odors or itching.  No burning with urination.  She is on her menstrual cycle so uncertain of any bleeding with urination.  No fevers reported or nausea or vomiting.  No history of previous UTI.  Has IUD in place for contraception   ROS: Per HPI  Allergies  Allergen Reactions   Aspirin Nausea And Vomiting   Sulfa Antibiotics Hives   Past Medical History:  Diagnosis Date   Depression    Pregnant     Current Outpatient Medications:    buPROPion  (WELLBUTRIN  XL) 300 MG 24 hr tablet, Take 1 tablet (300 mg total) by mouth daily., Disp: 90 tablet, Rfl: 1   busPIRone  (BUSPAR ) 10 MG tablet, TAKE 1 TABLET BY MOUTH TWICE A DAY, Disp: 180 tablet, Rfl: 0   levonorgestrel  (LILETTA ) 20.1 MCG/DAY IUD, 1 each by Intrauterine route once., Disp: , Rfl:    ondansetron  (ZOFRAN -ODT) 4 MG disintegrating tablet, Take 1 tablet (4 mg total) by mouth every 8 (eight) hours as needed for nausea or vomiting., Disp: 20 tablet, Rfl: 0   Semaglutide -Weight Management (WEGOVY ) 2.4 MG/0.75ML SOAJ, INJECT 2.4 MG INTO THE SKIN ONCE A WEEK FOR 28 DAYS., Disp: 3 mL, Rfl: 3  Gen: Well-appearing female no acute  distress  Assessment/ Plan: 28 y.o. female   Acute cystitis without hematuria - Plan: Urinalysis, Routine w reflex microscopic, Urine Culture, nitrofurantoin , macrocrystal-monohydrate, (MACROBID ) 100 MG capsule, fluconazole  (DIFLUCAN ) 150 MG tablet  Nitrite positive, over 30 white blood cells, over 30 red blood cells (currently on menstrual cycle).  Macrobid  sent for presumed E. coli UTI.  Diflucan  sent for as needed use.  Culture sent.  Will contact patient if needing alternative treatment plan  Start time: 12:45pm End time: 12:48pm  Total time spent on patient care (including video visit/ documentation): 5 minutes  Kristin Leonhard CHRISTELLA Fielding, DO [facility] [phone]

## 2024-01-11 LAB — URINE CULTURE

## 2024-01-13 ENCOUNTER — Ambulatory Visit: Payer: Self-pay | Admitting: Family Medicine

## 2024-01-13 DIAGNOSIS — B964 Proteus (mirabilis) (morganii) as the cause of diseases classified elsewhere: Secondary | ICD-10-CM

## 2024-01-13 MED ORDER — CEPHALEXIN 500 MG PO CAPS: 500.0000 mg | ORAL_CAPSULE | Freq: Three times a day (TID) | ORAL | 0 refills | Status: AC

## 2024-01-13 NOTE — Telephone Encounter (Signed)
 Patient aware and verbalized understanding.

## 2024-01-14 ENCOUNTER — Ambulatory Visit: Admitting: Family

## 2024-01-15 ENCOUNTER — Ambulatory Visit: Admitting: *Deleted

## 2024-01-15 DIAGNOSIS — Z23 Encounter for immunization: Secondary | ICD-10-CM

## 2024-01-15 NOTE — Progress Notes (Signed)
 Patient is in office today for a nurse visit for PPD. Patient Injection was given in the  Right arm. Patient tolerated injection well.

## 2024-01-17 ENCOUNTER — Ambulatory Visit

## 2024-01-17 LAB — TB SKIN TEST
Induration: 0 mm
TB Skin Test: NEGATIVE

## 2024-01-17 NOTE — Progress Notes (Signed)
 Patient presented today for a tb skin test read. Tb skin test was negative. Copy given to patient

## 2024-01-17 NOTE — Progress Notes (Signed)
 SABRA

## 2024-02-03 ENCOUNTER — Encounter: Payer: Self-pay | Admitting: Family Medicine

## 2024-02-03 ENCOUNTER — Ambulatory Visit: Admitting: Family Medicine

## 2024-02-03 VITALS — BP 128/87 | HR 83 | Ht 61.0 in | Wt 203.0 lb

## 2024-02-03 DIAGNOSIS — Z6841 Body Mass Index (BMI) 40.0 and over, adult: Secondary | ICD-10-CM | POA: Diagnosis not present

## 2024-02-03 NOTE — Progress Notes (Signed)
 BP 128/87   Pulse 83   Ht 5' 1 (1.549 m)   Wt 203 lb (92.1 kg)   BMI 38.36 kg/m    Subjective:   Patient ID: Kristin Morgan, female    DOB: 09/23/1995, 28 y.o.   MRN: [id number]  HPI: Kristin Morgan is a 28 y.o. female presenting on 02/03/2024 for Medical Management of Chronic Issues and Obesity   Discussed the use of AI scribe software for clinical note transcription with the patient, who gave verbal consent to proceed.  History of Present Illness       Morbid obesity and weight recheck Patient is coming in for morbid obesity and weight recheck.  She had been on Wegovy  but her insurance no longer covers it.  She wants to discuss other weight loss options.  Without her insurance covering it, we did discuss the possibility of getting tested for sleep apnea but she feels like she does not have it and we discussed the possibility of phentermine and she had issues with stimulants when she was younger child so does not know if she wants to try it.  She will think further about it.  She is going to try on her own.      Relevant past medical, surgical, family and social history reviewed and updated as indicated. Interim medical history since our last visit reviewed. Allergies and medications reviewed and updated.  Review of Systems  Constitutional:  Negative for chills and fever.  Eyes:  Negative for visual disturbance.  Respiratory:  Negative for chest tightness and shortness of breath.   Cardiovascular:  Negative for chest pain and leg swelling.  Genitourinary:  Negative for difficulty urinating and dysuria.  Musculoskeletal:  Negative for back pain and gait problem.  Skin:  Negative for rash.  Neurological:  Negative for dizziness, light-headedness and headaches.  Psychiatric/Behavioral:  Negative for agitation and behavioral problems.   All other systems reviewed and are negative.   Per HPI unless specifically indicated above   Allergies as of 02/03/2024       Reactions    Aspirin Nausea And Vomiting   Sulfa Antibiotics Hives        Medication List        Accurate as of February 03, 2024 12:08 PM. If you have any questions, ask your nurse or doctor.          STOP taking these medications    Wegovy  2.4 MG/0.75ML Soaj SQ injection Generic drug: semaglutide -weight management Stopped by: Fonda LABOR Sparkles Mcneely       TAKE these medications    buPROPion  300 MG 24 hr tablet Commonly known as: Wellbutrin  XL Take 1 tablet (300 mg total) by mouth daily.   busPIRone  10 MG tablet Commonly known as: BUSPAR  TAKE 1 TABLET BY MOUTH TWICE A DAY   levonorgestrel  20.1 MCG/DAY Iud IUD Commonly known as: LILETTA  1 each by Intrauterine route once.   ondansetron  4 MG disintegrating tablet Commonly known as: ZOFRAN -ODT Take 1 tablet (4 mg total) by mouth every 8 (eight) hours as needed for nausea or vomiting.         Objective:   BP 128/87   Pulse 83   Ht 5' 1 (1.549 m)   Wt 203 lb (92.1 kg)   BMI 38.36 kg/m   Wt Readings from Last 3 Encounters:  02/03/24 203 lb (92.1 kg)  12/19/23 207 lb (93.9 kg)  11/06/23 210 lb 12.8 oz (95.6 kg)    Physical Exam Vitals and nursing note  reviewed.  Constitutional:      General: She is not in acute distress.    Appearance: She is well-developed. She is not diaphoretic.  Eyes:     Conjunctiva/sclera: Conjunctivae normal.  Skin:    General: Skin is warm and dry.     Findings: No rash.  Neurological:     Mental Status: She is alert and oriented to person, place, and time.     Coordination: Coordination normal.  Psychiatric:        Behavior: Behavior normal.                 Assessment & Plan:   Problem List Items Addressed This Visit       Other   Morbid obesity with BMI of 40.0-44.9, adult (HCC) - Primary           We discussed weight loss options, she is going to try to continue on her own for now but we discussed the possibility of phentermine versus sleep apnea testing      Follow  up plan: Return in about 4 weeks (around 03/02/2024), or if symptoms worsen or fail to improve, for Obesity recheck.  Counseling provided for all of the vaccine components No orders of the defined types were placed in this encounter.   Fonda Levins, MD [facility] 02/03/2024, 12:08 PM

## 2024-02-20 ENCOUNTER — Ambulatory Visit: Admitting: Family Medicine

## 2024-03-02 ENCOUNTER — Encounter: Payer: Self-pay | Admitting: Family Medicine

## 2024-03-02 ENCOUNTER — Ambulatory Visit: Payer: Self-pay | Admitting: Family Medicine

## 2024-03-02 VITALS — BP 104/69 | HR 96 | Ht 61.0 in | Wt 205.0 lb

## 2024-03-02 DIAGNOSIS — Z6841 Body Mass Index (BMI) 40.0 and over, adult: Secondary | ICD-10-CM

## 2024-03-02 DIAGNOSIS — F339 Major depressive disorder, recurrent, unspecified: Secondary | ICD-10-CM

## 2024-03-02 MED ORDER — PHENTERMINE HCL 30 MG PO CAPS: 30.0000 mg | ORAL_CAPSULE | ORAL | 0 refills | Status: DC

## 2024-03-02 MED ORDER — BUSPIRONE HCL 10 MG PO TABS
10.0000 mg | ORAL_TABLET | Freq: Two times a day (BID) | ORAL | 1 refills | Status: AC
Start: 2024-03-02 — End: ?

## 2024-03-02 NOTE — Progress Notes (Signed)
 BP 104/69   Pulse 96   Ht 5' 1 (1.549 m)   Wt 205 lb (93 kg)   SpO2 95%   BMI 38.73 kg/m    Subjective:   Patient ID: Kristin Morgan, female    DOB: 12/28/1995, 28 y.o.   MRN: [id number]  HPI: Kristin Morgan is a 28 y.o. female presenting on 03/02/2024 for Medical Management of Chronic Issues and Depression   Discussed the use of AI scribe software for clinical note transcription with the patient, who gave verbal consent to proceed.  History of Present Illness   Kristin Morgan is a 28 year old female who presents with concerns about her IUD and weight management. She is accompanied by her child, Leonce.  Intrauterine device (iud) concerns - Suspects possible IUD displacement after experiencing pain and bleeding during physical activity - IUD has been in place for four years - Considering IUD removal  Weight gain and management - Recent weight gain of two pounds - Interested in weight management strategies, including consideration of phentermine  - Practicing portion control - Weight has been stable recently  Psychotropic medication use and anxiety - Currently taking Wellbutrin  and buspirone  for anxiety - Feels balanced with current medication regimen - Sleeping better than she has in a long time - Takes two Wellbutrin  at a time - Uncertain about current supply of buspirone  - No thoughts of self-harm or suicide  History of adhd medication use - Previously used ADHD medication - Mother reported aggression when doses were missed          Relevant past medical, surgical, family and social history reviewed and updated as indicated. Interim medical history since our last visit reviewed. Allergies and medications reviewed and updated.  Review of Systems  Constitutional:  Negative for chills and fever.  HENT:  Negative for ear discharge.   Eyes:  Negative for visual disturbance.  Respiratory:  Negative for chest tightness and shortness of breath.   Cardiovascular:  Negative for  chest pain and leg swelling.  Genitourinary:  Positive for menstrual problem and pelvic pain. Negative for difficulty urinating and dysuria.  Musculoskeletal:  Negative for back pain and gait problem.  Skin:  Negative for rash.  Neurological:  Negative for dizziness, light-headedness and headaches.  Psychiatric/Behavioral:  Negative for agitation and behavioral problems.   All other systems reviewed and are negative.   Per HPI unless specifically indicated above   Allergies as of 03/02/2024       Reactions   Aspirin Nausea And Vomiting   Sulfa Antibiotics Hives        Medication List        Accurate as of March 02, 2024  3:28 PM. If you have any questions, ask your nurse or doctor.          buPROPion  300 MG 24 hr tablet Commonly known as: Wellbutrin  XL Take 1 tablet (300 mg total) by mouth daily.   busPIRone  10 MG tablet Commonly known as: BUSPAR  Take 1 tablet (10 mg total) by mouth 2 (two) times daily.   levonorgestrel  20.1 MCG/DAY Iud IUD Commonly known as: LILETTA  1 each by Intrauterine route once.   ondansetron  4 MG disintegrating tablet Commonly known as: ZOFRAN -ODT Take 1 tablet (4 mg total) by mouth every 8 (eight) hours as needed for nausea or vomiting.   phentermine  30 MG capsule Take 1 capsule (30 mg total) by mouth every morning. Started by: Fonda LABOR Trevor Duty         Objective:  BP 104/69   Pulse 96   Ht 5' 1 (1.549 m)   Wt 205 lb (93 kg)   SpO2 95%   BMI 38.73 kg/m   Wt Readings from Last 3 Encounters:  03/02/24 205 lb (93 kg)  02/03/24 203 lb (92.1 kg)  12/19/23 207 lb (93.9 kg)    Physical Exam Vitals and nursing note reviewed.  Constitutional:      General: She is not in acute distress.    Appearance: She is well-developed. She is not diaphoretic.  Eyes:     Conjunctiva/sclera: Conjunctivae normal.  Skin:    General: Skin is warm and dry.     Findings: No rash.  Neurological:     Mental Status: She is alert and  oriented to person, place, and time.     Coordination: Coordination normal.  Psychiatric:        Behavior: Behavior normal.               Results for orders placed or performed in visit on 01/15/24  PPD   Collection Time: 01/17/24 11:06 AM  Result Value Ref Range   TB Skin Test Negative    Induration 0 mm    Assessment & Plan:   Problem List Items Addressed This Visit       Other   Morbid obesity with BMI of 40.0-44.9, adult (HCC)   Relevant Medications   phentermine  30 MG capsule   Depression, recurrent - Primary   Relevant Medications   busPIRone  (BUSPAR ) 10 MG tablet        Depression Well-managed with Wellbutrin  and buspirone . No suicidal ideation or self-harm thoughts. Improved sleep reported. - Continue Wellbutrin  and buspirone . - Refilled buspirone  prescription.  Morbid obesity Weight gain of two pounds. Interest in phentermine  for weight management. Emphasized portion control and regular exercise. - Prescribed phentermine  for weight management. - Advised on portion control and regular exercise.  Displaced intrauterine device (IUD) Displaced IUD causing pain and bleeding. Considering NuvaRing as an alternative. Hesitant about permanent options like tubal ligation. - Will schedule IUD removal. - Discussed NuvaRing as an alternative contraceptive option.          Follow up plan: Return in about 4 weeks (around 03/30/2024), or if symptoms worsen or fail to improve, for Obesity weight recheck.  Counseling provided for all of the vaccine components No orders of the defined types were placed in this encounter.   Fonda Levins, MD [facility] 03/02/2024, 3:28 PM

## 2024-03-26 ENCOUNTER — Other Ambulatory Visit (HOSPITAL_COMMUNITY)
Admission: RE | Admit: 2024-03-26 | Discharge: 2024-03-26 | Disposition: A | Discharge status: Home / Self Care | Source: Ambulatory Visit | Attending: Family Medicine | Admitting: Family Medicine

## 2024-03-26 ENCOUNTER — Ambulatory Visit: Admitting: Family Medicine

## 2024-03-26 ENCOUNTER — Encounter: Payer: Self-pay | Admitting: Family Medicine

## 2024-03-26 VITALS — BP 115/79 | HR 95 | Ht 61.0 in | Wt 213.0 lb

## 2024-03-26 DIAGNOSIS — Z0000 Encounter for general adult medical examination without abnormal findings: Secondary | ICD-10-CM

## 2024-03-26 DIAGNOSIS — F339 Major depressive disorder, recurrent, unspecified: Secondary | ICD-10-CM

## 2024-03-26 DIAGNOSIS — Z30432 Encounter for removal of intrauterine contraceptive device: Secondary | ICD-10-CM | POA: Diagnosis not present

## 2024-03-26 DIAGNOSIS — Z6841 Body Mass Index (BMI) 40.0 and over, adult: Secondary | ICD-10-CM

## 2024-03-26 MED ORDER — ETONOGESTREL-ETHINYL ESTRADIOL 0.12-0.015 MG/24HR VA RING: VAGINAL_RING | VAGINAL | 12 refills | Status: AC

## 2024-03-26 NOTE — Progress Notes (Unsigned)
 BP 115/79   Pulse 95   Ht 5' 1 (1.549 m)   Wt 213 lb (96.6 kg)   LMP 03/23/2024 (Approximate)   SpO2 98%   BMI 40.25 kg/m    Subjective:   Patient ID: Kristin Morgan, female    DOB: [birth year], 28 y.o.   MRN: [id number]  HPI: Kristin Morgan is a 28 y.o. female presenting on 03/26/2024 for IUD Removal   Discussed the use of AI scribe software for clinical note transcription with the patient, who gave verbal consent to proceed.  History of Present Illness              Relevant past medical, surgical, family and social history reviewed and updated as indicated. Interim medical history since our last visit reviewed. Allergies and medications reviewed and updated.  Review of Systems  Per HPI unless specifically indicated above   Allergies as of 03/26/2024       Reactions   Aspirin Nausea And Vomiting   Sulfa Antibiotics Hives        Medication List        Accurate as of March 26, 2024  4:32 PM. If you have any questions, ask your nurse or doctor.          STOP taking these medications    levonorgestrel  20.1 MCG/DAY Iud IUD Commonly known as: LILETTA  Stopped by: Fonda Levins, MD   phentermine  30 MG capsule Stopped by: Fonda Levins, MD       TAKE these medications    buPROPion  300 MG 24 hr tablet Commonly known as: Wellbutrin  XL Take 1 tablet (300 mg total) by mouth daily.   busPIRone  10 MG tablet Commonly known as: BUSPAR  Take 1 tablet (10 mg total) by mouth 2 (two) times daily.   etonogestrel -ethinyl estradiol  0.12-0.015 MG/24HR vaginal ring Commonly known as: NuvaRing Insert vaginally and leave in place for 3 consecutive weeks, then remove for 1 week. Started by: Fonda Levins, MD   ondansetron  4 MG disintegrating tablet Commonly known as: ZOFRAN -ODT Take 1 tablet (4 mg total) by mouth every 8 (eight) hours as needed for nausea or vomiting.         Objective:   BP 115/79   Pulse 95   Ht 5' 1 (1.549 m)   Wt 213 lb  (96.6 kg)   LMP 03/23/2024 (Approximate)   SpO2 98%   BMI 40.25 kg/m   Wt Readings from Last 3 Encounters:  03/26/24 213 lb (96.6 kg)  03/02/24 205 lb (93 kg)  02/03/24 203 lb (92.1 kg)    Physical Exam Physical Exam             Assessment & Plan:   Problem List Items Addressed This Visit       Other   Morbid obesity with BMI of 40.0-44.9, adult (HCC)   Relevant Orders   CMP14+EGFR   Lipid panel   Depression, recurrent   Relevant Medications   buPROPion  (WELLBUTRIN  XL) 300 MG 24 hr tablet   Other Relevant Orders   CBC With Diff/Platelet   Other Visit Diagnoses       Physical exam    -  Primary   Relevant Orders   Cytology - PAP([place])   CBC With Diff/Platelet   CMP14+EGFR   Lipid panel                      Follow up plan: Return in about 3 months (around 06/24/2024), or if symptoms worsen or  fail to improve, for Depression recheck.  Counseling provided for all of the vaccine components Orders Placed This Encounter  Procedures   CBC With Diff/Platelet   CMP14+EGFR   Lipid panel    Fonda Levins, MD [facility] 03/26/2024, 4:32 PM

## 2024-04-01 LAB — CYTOLOGY - PAP
Comment: NEGATIVE
Diagnosis: NEGATIVE
High risk HPV: NEGATIVE

## 2024-04-06 ENCOUNTER — Ambulatory Visit: Payer: Self-pay | Admitting: Family Medicine

## 2024-04-24 ENCOUNTER — Encounter: Payer: Self-pay | Admitting: Family Medicine

## 2024-04-30 ENCOUNTER — Encounter: Admitting: Obstetrics & Gynecology

## 2024-05-25 ENCOUNTER — Ambulatory Visit: Admitting: Family Medicine

## 2024-06-18 ENCOUNTER — Ambulatory Visit: Admitting: Family Medicine
